# Patient Record
Sex: Female | Born: 1950 | Race: Black or African American | Hispanic: No | Marital: Married | Smoking: Never smoker
Health system: Southern US, Community
[De-identification: ages and names within clinical notes are randomized; demographics above are authoritative.]

## PROBLEM LIST (undated history)

## (undated) DIAGNOSIS — I499 Cardiac arrhythmia, unspecified: Secondary | ICD-10-CM

## (undated) DIAGNOSIS — I2510 Atherosclerotic heart disease of native coronary artery without angina pectoris: Secondary | ICD-10-CM

## (undated) DIAGNOSIS — M4316 Spondylolisthesis, lumbar region: Secondary | ICD-10-CM

## (undated) DIAGNOSIS — Z87442 Personal history of urinary calculi: Secondary | ICD-10-CM

## (undated) DIAGNOSIS — I10 Essential (primary) hypertension: Secondary | ICD-10-CM

## (undated) DIAGNOSIS — Z9889 Other specified postprocedural states: Secondary | ICD-10-CM

## (undated) DIAGNOSIS — I428 Other cardiomyopathies: Secondary | ICD-10-CM

## (undated) DIAGNOSIS — G4730 Sleep apnea, unspecified: Secondary | ICD-10-CM

## (undated) DIAGNOSIS — M1990 Unspecified osteoarthritis, unspecified site: Secondary | ICD-10-CM

## (undated) HISTORY — PX: HERNIA REPAIR: SHX51

## (undated) HISTORY — PX: KIDNEY STONE SURGERY: SHX686

## (undated) HISTORY — PX: DILATION AND CURETTAGE OF UTERUS: SHX78

---

## 2017-07-16 ENCOUNTER — Other Ambulatory Visit: Payer: Self-pay | Admitting: Neurosurgery

## 2017-07-27 NOTE — Pre-Procedure Instructions (Addendum)
Tracey Spencer  07/27/2017      [facility] - [place], [place] - [address] [place] [address] Phone: [phone] Fax: [phone]    Your procedure is scheduled on Tues., Aug 07, 2017 from 11:30AM- 3:17PM  Report to [facility] Admitting Entrance "A" at 9:30AM  Call this number if you have problems the morning of surgery:  [phone]   Remember:  No food or drink after midnight on May 27th  Take these medicines the morning of surgery with A SIP OF WATER: Carvedilol (COREG)  Follow your doctors instructions regarding your Aspirin.  If no instructions were given by your doctor, then you will need to call the prescribing office office to get instructions.    7 days before surgery (5/21), stop taking all Other Aspirin Products, Vitamins, Fish oils, and Herbal medications. Also stop all NSAIDS i.e. Advil, Ibuprofen, Motrin, Aleve, Anaprox, Naproxen, BC and Goody Powders.    Do not wear jewelry, make-up or nail polish.  Do not wear lotions, powders, or perfumes, or deodorant.  Do not shave 48 hours prior to surgery.  Do not bring valuables to the hospital.  [facility] is not responsible for any belongings or valuables.  Contacts, dentures or bridgework may not be worn into surgery.  Leave your suitcase in the car.  After surgery it may be brought to your room.  For patients admitted to the hospital, discharge time will be determined by your treatment team.  Patients discharged the day of surgery will not be allowed to drive home.   Special instructions:   Tracey Spencer- Preparing For Surgery  Before surgery, you can play an important role. Because skin is not sterile, your skin needs to be as free of germs as possible. You can reduce the number of germs on your skin by washing with CHG (chlorahexidine gluconate) Soap before surgery.  CHG is an antiseptic cleaner which kills germs and bonds with the skin to continue killing germs even after  washing.  Oral Hygiene is also important to reduce your risk of infection.  Remember - BRUSH YOUR TEETH THE MORNING OF SURGERY  Please do not use if you have an allergy to CHG or antibacterial soaps. If your skin becomes reddened/irritated stop using the CHG.  Do not shave (including legs and underarms) for at least 48 hours prior to first CHG shower. It is OK to shave your face.  Please follow these instructions carefully.   1. Shower the NIGHT BEFORE SURGERY and the MORNING OF SURGERY with CHG.   2. If you chose to wash your hair, wash your hair first as usual with your normal shampoo.  3. After you shampoo, rinse your hair and body thoroughly to remove the shampoo.  4. Use CHG as you would any other liquid soap. You can apply CHG directly to the skin and wash gently with a scrungie or a clean washcloth.   5. Apply the CHG Soap to your body ONLY FROM THE NECK DOWN.  Do not use on open wounds or open sores. Avoid contact with your eyes, ears, mouth and genitals (private parts). Wash Face and genitals (private parts)  with your normal soap.  6. Wash thoroughly, paying special attention to the area where your surgery will be performed.  7. Thoroughly rinse your body with warm water from the neck down.  8. DO NOT shower/wash with your normal soap after using and rinsing off the CHG Soap.  9. Pat yourself dry with a CLEAN TOWEL.  10. Wear CLEAN PAJAMAS to bed the night before surgery, wear comfortable clothes the morning of surgery  11. Place CLEAN SHEETS on your bed the night of your first shower and DO NOT SLEEP WITH PETS.  Day of Surgery:  Do not apply any deodorants/lotions.  Please wear clean clothes to the hospital/surgery center.   Remember to brush your teeth.    Please read over the following fact sheets that you were given. Pain Booklet, Coughing and Deep Breathing, MRSA Information and Surgical Site Infection Prevention

## 2017-07-30 ENCOUNTER — Encounter (HOSPITAL_COMMUNITY): Payer: Self-pay

## 2017-07-30 ENCOUNTER — Other Ambulatory Visit: Payer: Self-pay

## 2017-07-30 ENCOUNTER — Encounter (HOSPITAL_COMMUNITY)
Admission: RE | Admit: 2017-07-30 | Discharge: 2017-07-30 | Disposition: A | Discharge status: Home / Self Care | Payer: BLUE CROSS/BLUE SHIELD | Source: Ambulatory Visit | Attending: Neurosurgery | Admitting: Neurosurgery

## 2017-07-30 DIAGNOSIS — Z7982 Long term (current) use of aspirin: Secondary | ICD-10-CM | POA: Insufficient documentation

## 2017-07-30 DIAGNOSIS — Z0183 Encounter for blood typing: Secondary | ICD-10-CM | POA: Insufficient documentation

## 2017-07-30 DIAGNOSIS — Z87442 Personal history of urinary calculi: Secondary | ICD-10-CM | POA: Insufficient documentation

## 2017-07-30 DIAGNOSIS — M1990 Unspecified osteoarthritis, unspecified site: Secondary | ICD-10-CM | POA: Diagnosis not present

## 2017-07-30 DIAGNOSIS — I10 Essential (primary) hypertension: Secondary | ICD-10-CM | POA: Diagnosis not present

## 2017-07-30 DIAGNOSIS — Z01818 Encounter for other preprocedural examination: Secondary | ICD-10-CM | POA: Insufficient documentation

## 2017-07-30 DIAGNOSIS — M4316 Spondylolisthesis, lumbar region: Secondary | ICD-10-CM | POA: Diagnosis not present

## 2017-07-30 DIAGNOSIS — Z79899 Other long term (current) drug therapy: Secondary | ICD-10-CM | POA: Diagnosis not present

## 2017-07-30 DIAGNOSIS — R9431 Abnormal electrocardiogram [ECG] [EKG]: Secondary | ICD-10-CM | POA: Diagnosis not present

## 2017-07-30 DIAGNOSIS — Z01812 Encounter for preprocedural laboratory examination: Secondary | ICD-10-CM | POA: Insufficient documentation

## 2017-07-30 DIAGNOSIS — I4510 Unspecified right bundle-branch block: Secondary | ICD-10-CM | POA: Diagnosis not present

## 2017-07-30 DIAGNOSIS — I2510 Atherosclerotic heart disease of native coronary artery without angina pectoris: Secondary | ICD-10-CM | POA: Insufficient documentation

## 2017-07-30 DIAGNOSIS — I428 Other cardiomyopathies: Secondary | ICD-10-CM | POA: Insufficient documentation

## 2017-07-30 DIAGNOSIS — G4730 Sleep apnea, unspecified: Secondary | ICD-10-CM | POA: Insufficient documentation

## 2017-07-30 HISTORY — DX: Spondylolisthesis, lumbar region: M43.16

## 2017-07-30 HISTORY — DX: Other specified postprocedural states: Z98.890

## 2017-07-30 HISTORY — DX: Other cardiomyopathies: I42.8

## 2017-07-30 HISTORY — DX: Unspecified osteoarthritis, unspecified site: M19.90

## 2017-07-30 HISTORY — DX: Atherosclerotic heart disease of native coronary artery without angina pectoris: I25.10

## 2017-07-30 HISTORY — DX: Cardiac arrhythmia, unspecified: I49.9

## 2017-07-30 HISTORY — DX: Personal history of urinary calculi: Z87.442

## 2017-07-30 HISTORY — DX: Sleep apnea, unspecified: G47.30

## 2017-07-30 HISTORY — DX: Essential (primary) hypertension: I10

## 2017-07-30 LAB — TYPE AND SCREEN
ABO/RH(D): B POS
Antibody Screen: NEGATIVE

## 2017-07-30 LAB — BASIC METABOLIC PANEL
Anion gap: 8 (ref 5–15)
BUN: 11 mg/dL (ref 6–20)
CHLORIDE: 103 mmol/L (ref 101–111)
CO2: 30 mmol/L (ref 22–32)
Calcium: 9.3 mg/dL (ref 8.9–10.3)
Creatinine, Ser: 0.74 mg/dL (ref 0.44–1.00)
GFR calc Af Amer: >60 mL/min (ref >60)
GFR calc non Af Amer: >60 mL/min (ref >60)
GLUCOSE: 89 mg/dL (ref 65–99)
Potassium: 3.7 mmol/L (ref 3.5–5.1)
SODIUM: 141 mmol/L (ref 135–145)

## 2017-07-30 LAB — CBC
HEMATOCRIT: 43.5 % (ref 36.0–46.0)
HEMOGLOBIN: 14.1 g/dL (ref 12.0–15.0)
MCH: 28.7 pg (ref 26.0–34.0)
MCHC: 32.4 g/dL (ref 30.0–36.0)
MCV: 88.4 fL (ref 78.0–100.0)
Platelets: 204 10*3/uL (ref 150–400)
RBC: 4.92 MIL/uL (ref 3.87–5.11)
RDW: 13 % (ref 11.5–15.5)
WBC: 3.1 10*3/uL — AB (ref 4.0–10.5)

## 2017-07-30 LAB — SURGICAL PCR SCREEN
MRSA, PCR: NEGATIVE
STAPHYLOCOCCUS AUREUS: NEGATIVE

## 2017-07-30 LAB — ABO/RH: ABO/RH(D): B POS

## 2017-07-30 NOTE — Progress Notes (Signed)
[facility]  Cardiologist - Dr. Lillard Anes  Chest x-ray - Denies  EKG - 07/30/17  Stress Test - Denies  ECHO - 11/16/15 (CE)  Cardiac Cath - 01/15/14 (E)  Sleep Study - Yes- Positive CPAP - Yes  LABS- 07/30/17: CBC, BMP, T/S  ASA- LD- 5/17   Anesthesia- Yes- cardiac history  Pt denies having chest pain, sob, or fever at this time. All instructions explained to the pt, with a verbal understanding of the material. Pt agrees to go over the instructions while at home for a better understanding. The opportunity to ask questions was provided.

## 2017-07-31 ENCOUNTER — Encounter (HOSPITAL_COMMUNITY): Payer: Self-pay

## 2017-07-31 NOTE — Progress Notes (Signed)
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   - Has cardiac clearance for surgery   VS: BP (!) 166/87   Pulse 65   Temp 36.8 C   Resp 20   Ht 5\' 2"  (1.575 m)   Wt 192 lb 1.6 oz (87.1 kg)   SpO2 100%   BMI 35.14 kg/m    PROVIDERS: PCP is Renaldo Harrison, MD in [place], [place] Cardiologist is Lillard Anes, MD (notes in care everywhere) who has cleared pt for surgery. Last office visit 04/03/17   LABS: Labs reviewed: Acceptable for surgery. (all labs ordered are listed, but only abnormal results are displayed)  Labs Reviewed  CBC - Abnormal; Notable for the following components:      Result Value   WBC 3.1 (*)    All other components within normal limits  SURGICAL PCR SCREEN  BASIC METABOLIC PANEL  TYPE AND SCREEN  ABO/RH    EKG 07/30/17: NSR. LAD. Incomplete RBBB. Minimal voltage criteria for LVH, may be normal variant. Septal infarct, age undetermined   CV:   Nuclear stress test 03/26/2017 [facility] [place]): 1.  Normal perfusion.  No ischemia. 2.  Normal LV wall motion and function.  EF 64%.  Echo 03/25/2017 ([facility] [place]): 1.  Mild mitral regurgitation. 2.  Mild to moderate cuspid regurgitation. 3.  Trace pulmonic regurgitation. 4.  EF appears similar to prior echo 2015.  EF 40-45% with global hypokinesia. 5.  Mitral regurgitation.  Previously it was more significant, it is at least moderate before, now mild.  LA not enlarged.  Cardiac cath 11/16/2013 [facility]): 1.  LM normal. 2.  LAD with diffuse irregularities in mid segment (<25%) gives rise to several diagonal branches without disease. 3.  CX: Minimal irregularities in proximal segment (10-25%). 4.  Tiny ramus intermedius is grossly angiographically normal. 5.  RCA: Dominant.  Proximal diffuse irregularities (<25%).  More distally at the site of the bifurcation to the PLA and PDA, there is focal stenosis involving PLA and only about 25% of the PDA is left with about 80% ostial stenosis.  The vessel is very small. 6. Mild nonischemic dilated cardiomyopathy. 7.  Mitral regurgitation estimated to be in the moderately severe range and based on previous echo assessment without any organic disease of the mitral valve itself with a central jet indicating functional etiology likely related to LV dysfunction   Past Medical History:  Diagnosis Date  . Arthritis   . Coronary artery disease    mild nonobstructive by 2015 cath   . History of kidney stones   . History of radiofrequency ablation (RFA) procedure for cardiac arrhythmia    for atrial tachycardia  . Hypertension   . Irregular heart beat   . Nonischemic cardiomyopathy (HCC)   . Sleep apnea    Uses Cpap  . Spondylolisthesis of lumbar region     Past Surgical History:  Procedure Laterality Date  .  DILATION AND CURETTAGE OF UTERUS    . HERNIA REPAIR     abdominal  . KIDNEY STONE SURGERY      MEDICATIONS: . aspirin EC 81 MG tablet  . atorvastatin (LIPITOR) 40 MG tablet  . carvedilol (COREG) 6.25 MG tablet  . Cholecalciferol (VITAMIN D-3) 5000 units TABS  . lisinopril (PRINIVIL,ZESTRIL) 20 MG tablet   No current facility-administered medications for this encounter.     If no changes, I anticipate pt can proceed with surgery as scheduled.   Rica Mast, FNP-BC [facility]hesiology Phone: [phone] 07/31/2017 4:22 PM

## 2017-08-03 NOTE — H&P (Signed)
Patient ID:   [id number] Patient: Tracey Spencer  Date of Birth: [birth year] Visit Type: Office Visit   Date: 07/16/2017 10:00 AM Provider: Danae Orleans. Venetia Maxon MD   This 67 year old female presents for back pain.  HISTORY OF PRESENT ILLNESS:  1.  back pain  Patient returns to discuss surgery plan.  Pt reports some numbness on the left side. 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 Nurse education given.  On exam, she has worsening weakness in her left leg with DF/EHL 4-/5.    Physical therapist canceled therapy and told patient it was not working and that she needed to go ahead with surgery.         Medical/Surgical/Interim History Reviewed, no change.  Last detailed document date:05/07/2017.     PAST MEDICAL HISTORY, SURGICAL HISTORY, FAMILY HISTORY, SOCIAL HISTORY AND REVIEW OF SYSTEMS I have reviewed the patient's past medical, surgical, family and social history as well as the comprehensive review of systems as included on the [facility] history form dated 05/07/2017, which I have signed.  Family History:  Reviewed, no changes.  Last detailed document date:05/07/2017.   Social History: Reviewed, no changes. Last detailed document date: 05/07/2017.    MEDICATIONS: (added, continued or stopped this visit) Started Medication Directions Instruction Stopped   aspirin 81 mg tablet,delayed release take 1 tablet by oral route  every day     atorvastatin 40 mg tablet take 1 tablet by oral route  every day     carvedilol 6.25 mg tablet take 1 tablet by oral route 2 times every day with food     lisinopril 20 mg tablet take 1 tablet by oral route  every day       ALLERGIES: Ingredient Reaction Medication Name Comment  NO KNOWN ALLERGIES     No known allergies. Reviewed, no changes.    PHYSICAL EXAM:   Vitals Date Temp F BP Pulse Ht In Wt Lb  BMI BSA Pain Score  07/16/2017  146/89 66 62 196 35.85  10/10    PHYSICAL EXAM Details General Level of Distress: no acute distress Overall Appearance: normal    Cardiovascular Cardiac: regular rate and rhythm without murmur  Respiratory Lungs: clear to auscultation  Neurological Recent and Remote Memory: normal Attention Span and Concentration:   normal Language: normal Fund of Knowledge: normal  Right Left Sensation: normal normal Upper Extremity Coordination: normal normal  Lower Extremity Coordination: normal normal  Musculoskeletal Gait and Station: normal  Right Left Upper Extremity Muscle Strength: normal normal Lower Extremity Muscle Strength: normal normal Upper Extremity Muscle Tone:  normal normal Lower Extremity Muscle Tone: normal normal   Motor Strength Upper and lower extremity motor strength was tested in the clinically pertinent muscles. Any abnormal findings will be noted below.   Right Left EHL:  4-/5   Deep Tendon Reflexes  Right Left Biceps: normal normal Triceps: normal normal Brachioradialis: normal normal Patellar: normal normal Achilles: normal normal  Sensory Sensation was tested at L1 to S1.   Cranial Nerves II. Optic Nerve/Visual Fields: normal III. Oculomotor: normal IV. Trochlear: normal V. Trigeminal: normal VI. Abducens: normal VII. Facial: normal VIII. Acoustic/Vestibular: normal IX. Glossopharyngeal: normal X. Vagus: normal XI. Spinal Accessory: normal XII. Hypoglossal: normal  Motor and other Tests Lhermittes: negative Rhomberg: negative    Right Left Hoffman's: normal normal Clonus: normal normal Babinski: normal normal SLR: negative negative Patrick's (  Faber): negative negative Toe Walk: normal normal Toe Lift: normal normal Heel Walk: normal normal SI Joint: nontender nontender   Additional Findings:  Dorsiflexion 4-/5    IMPRESSION:   Pt reports some numbness on the left side. 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   PLAN:  Schedule left TLIF L4-5, L5-S1. Nurse education given.  Orders: Diagnostic Procedures: Assessment Procedure  M43.16 Lumbar Spine- AP/Lat/Flex/Ex  M54.16 Lumbar Spine- AP/Lat  Instruction(s)/Education: Assessment Instruction  I10 Hypertension education  Z68.35 Dietary management education, guidance, and counseling  Miscellaneous: Assessment   M43.16 LSO Brace   Completed Orders (this encounter) Order Details Reason Side Interpretation Result Initial Treatment Date Region  Hypertension education Patient to follow up with primary care provider.        Dietary management education, guidance, and counseling patient encouraged to eat a well balanced diet         Assessment/Plan   # Detail Type Description   1. Assessment Low back pain, unspecified back pain laterality, with sciatica presence unspecified (M54.5).       2. Assessment Spondylolisthesis, lumbar region (M43.16).   Plan Orders LSO Brace.       3. Assessment Lumbar radiculopathy (M54.16).       4. Assessment Left leg weakness (R29.898).       5. Assessment Essential (primary) hypertension (I10).       6. Assessment Body mass index (BMI) 35.0-35.9, adult (Z68.35).   Plan Orders Today's instructions / counseling include(s) Dietary management education, guidance, and counseling.         Pain Management Plan Pain Scale: 10/10. Method: Numeric Pain Intensity Scale. Location: back. Onset: 02/23/2017. Duration: varies. Quality: discomforting. Pain management follow-up plan of care: Patient is taking OTC pain relievers for relief..              Provider:  Venetia Maxon MD, Danae Orleans 07/16/2017 4:34 PM  Dictation edited by: Danae Orleans. Venetia Maxon    CC Providers: Renaldo Harrison [address] [place],  [place]  [address]-   Maeola Harman MD  [address] [place], [place]  [address]              Electronically signed by Danae Orleans. Venetia Maxon MD on 07/16/2017 04:34 PM  Patient ID:   [id number] Patient: Tracey Spencer  Date of Birth: 1951/02/17 Visit Type: Office Visit   Date: 06/06/2017 01:30 PM Provider: Danae Orleans. Venetia Maxon MD   This 67 year old female presents for MRI review.  HISTORY OF PRESENT ILLNESS:  1.  MRI review  Patient returns to review her MRI         Medical/Surgical/Interim History Reviewed, no change.  Last detailed document date:05/07/2017.     Family History:  Reviewed, no changes.  Last detailed document date:05/07/2017.   Social History: Reviewed, no changes. Last detailed document date: 05/07/2017.    MEDICATIONS: (added, continued or stopped this visit) Started Medication Directions Instruction Stopped   aspirin 81 mg tablet,delayed release take 1 tablet by oral route  every day     atorvastatin 40 mg tablet take 1 tablet by oral route  every day     carvedilol 6.25 mg tablet take 1 tablet by oral route 2 times every day with food     lisinopril 20 mg tablet take 1 tablet by oral route  every day       ALLERGIES: Ingredient Reaction Medication Name Comment  NO KNOWN ALLERGIES  No known allergies. Reviewed, no changes.    PHYSICAL EXAM:   Vitals Date Temp F BP Pulse Ht In Wt Lb BMI BSA Pain Score  06/06/2017  143/88 66 62 188.4 34.46  10/10      IMPRESSION:   Pt notes no changes in left buttock region. Pain upon sitting. Spondylolisthesis at L4-5 L5-S1 with nerve compression partially caused by a synovial cyst. Recommend physical therapy and injections at the left L5 and S1. Discussed future decompression and fusion surgery.  This would consist of TLIF procedure L4-5 and L5-S1 levels  PLAN:  Nurse education given. Scheduled physical therapy and left L5 and S1 injections. Return for recheck 3-4 weeks after injection  Orders: Office Procedures/Services: Assessment Service Comments  M54.16  Lumbar Spine - NRB - left - L5 - S1    Instruction(s)/Education: Assessment Instruction  I10 Hypertension education  [phone] Dietary management education, guidance, and counseling   Completed Orders (this encounter) Order Details Reason Side Interpretation Result Initial Treatment Date Region  Hypertension education Patient to follow up with primary care provider.        Dietary management education, guidance, and counseling patient encouraged to eat a well balanced diet         Assessment/Plan   # Detail Type Description   1. Assessment Spondylolisthesis, lumbar region (M43.16).       2. Assessment Lumbar radiculopathy (M54.16).   Plan Orders Referral to Odette Fraction PhD MD. Lumbar Spine - NRB - left - L5 - S1.       3. Assessment Essential (primary) hypertension (I10).       4. Assessment Body mass index (BMI) 34.0-34.9, adult (Z68.34).   Plan Orders Today's instructions / counseling include(s) Dietary management education, guidance, and counseling.         Pain Management Plan Pain Scale: 10/10. Method: Numeric Pain Intensity Scale. Location: back. Onset: 02/23/2017. Duration: varies. Quality: discomforting. Pain management follow-up plan of care: Patient is taking OTC pain relievers for relief..              Provider:  Venetia Maxon MD, Danae Orleans 06/10/2017 2:12 PM  Dictation edited by: Danae Orleans. Venetia Maxon    CC Providers: Renaldo Harrison [address] [place],  [place]  [address]-   Maeola Harman MD  [address] [place], [place] [address]              Electronically signed by Danae Orleans. Venetia Maxon MD on 06/10/2017 02:12 PM  Patient ID:   [id number] Patient: Tracey Spencer  Date of Birth: 05-01-1950 Visit Type: Office Visit   Date: 05/07/2017 09:15 AM Provider: Danae Orleans. Venetia Maxon MD   This 68 year old female presents for back pain.   History of Present Illness: 1.  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  Patient recalls slipping on ice December 2018. Left lumbar pain and spasm increased her early January with some upper back pain as well.  She notes "throbbing" pain with sitting, increased pain left buttock and hip with sweeping or waxing floors.  Percocet provided only short term relief.  Stopped Tylenol taken only as needed  History:  HTN, cholesterol, angina (cardiologist Dr. Milinda Antis is a at [facility]) Surgical history:  October 2015 cardiac ablation, December 2018 lithotripsy for kidney stone  CT and x-rays on [facility]  Lumbar radiographs demonstrate spondylolisthesis of L4 on L5 and of L5 on S1 levels at L4-5  on neutral it is a 8 mm decreasing diff 6 mm on flexion increasing to 8 mm on extension and at L5-S1 level neutral is 7 mm and neutral, 7 mm in flexion, 9 mm in extension.   The patient is complaining of left-sided low back and buttock pain.  She says that it problems all day long and it is hard sharp pain she complains of pain into the left foot to the shin and to the ankle.  She is still working but says it is very difficult for her to do so.           PAST MEDICAL/SURGICAL HISTORY   (Detailed)  Disease/disorder Onset Date Management Date Comments    lithotripsy    History of kidney stone      Hypertension         PAST MEDICAL HISTORY, SURGICAL HISTORY, FAMILY HISTORY, SOCIAL HISTORY AND REVIEW OF SYSTEMS I have reviewed the patient's past medical, surgical, family and social history as well as the comprehensive review of systems as included on the [facility] history form dated 05/07/2017, which I have signed.  Family History  (Detailed) Relationship Family Member Name Deceased Age at Death Condition Onset Age Cause of Death      Family history of Hypertension  N      Family history of Diabetes mellitus  N     Social History:  (Detailed) Tobacco use reviewed. Preferred  language is [place].   Tobacco use status: Current non-smoker. Smoking status: Never smoker.  SMOKING STATUS Type Smoking Status Usage Per Day Years Used Total Pack Years   Never smoker          MEDICATIONS(added, continued or stopped this visit): Started Medication Directions Instruction Stopped   aspirin 81 mg tablet,delayed release take 1 tablet by oral route  every day     atorvastatin 40 mg tablet take 1 tablet by oral route  every day     carvedilol 6.25 mg tablet take 1 tablet by oral route 2 times every day with food     lisinopril 20 mg tablet take 1 tablet by oral route  every day       ALLERGIES: Ingredient Reaction Medication Name Comment  NO KNOWN ALLERGIES     No known allergies.   Review of Systems System Neg/Pos Details  Constitutional Negative Chills, Fatigue, Fever, Malaise, Night sweats, Weight gain and Weight loss.  ENMT Negative Ear drainage, Hearing loss, Nasal drainage, Otalgia, Sinus pressure and Sore throat.  Eyes Negative Eye discharge, Eye pain and Vision changes.  Respiratory Negative Chronic cough, Cough, Dyspnea, Known TB exposure and Wheezing.  Cardio Negative Chest pain, Claudication, Edema and Irregular heartbeat/palpitations.  GI Negative Abdominal pain, Blood in stool, Change in stool pattern, Constipation, Decreased appetite, Diarrhea, Heartburn, Nausea and Vomiting.  GU Negative Dysuria, Hematuria, Polyuria (Genitourinary), Urinary frequency, Urinary incontinence and Urinary retention.  Endocrine Negative Cold intolerance, Heat intolerance, Polydipsia and Polyphagia.  Neuro Positive Gait disturbance.  Psych Negative Anxiety, Depression and Insomnia.  Integumentary Negative Brittle hair, Brittle nails, Change in shape/size of mole(s), Hair loss, Hirsutism, Hives, Pruritus, Rash and Skin lesion.  MS Positive Back pain.  Hema/Lymph Negative Easy bleeding, Easy bruising and Lymphadenopathy.  Allergic/Immuno Negative Contact allergy,  Environmental allergies, Food allergies and Seasonal allergies.  Reproductive Negative Breast discharge, Breast lumps, Dysmenorrhea, Dyspareunia, History of abnormal PAP smear, Hot flashes, Irregular menses and Vaginal discharge.   Vitals Date Temp F BP Pulse Ht In Wt Lb  BMI BSA Pain Score  05/07/2017  146/100 65 62 187.8 34.35  10/10     PHYSICAL EXAM General Level of Distress: no acute distress Overall Appearance: normal  Head and Face  Right Left  Fundoscopic Exam:  normal normal    Cardiovascular Cardiac: regular rate and rhythm without murmur  Right Left  Carotid Pulses: normal normal  Respiratory Lungs: clear to auscultation  Neurological Orientation: normal Recent and Remote Memory: normal Attention Span and Concentration:   normal Language: normal Fund of Knowledge: normal  Right Left Sensation: normal normal Upper Extremity Coordination: normal normal  Lower Extremity Coordination: normal normal  Musculoskeletal Gait and Station: normal  Right Left Upper Extremity Muscle Strength: normal normal Lower Extremity Muscle Strength: normal normal Upper Extremity Muscle Tone:  normal normal Lower Extremity Muscle Tone: normal normal   Motor Strength Upper and lower extremity motor strength was tested in the clinically pertinent muscles.     Deep Tendon Reflexes  Right Left Biceps: normal normal Triceps: normal normal Brachioradialis: normal normal Patellar: normal normal Achilles: normal normal  Sensory Sensation was tested at L1 to S1.   Cranial Nerves II. Optic Nerve/Visual Fields: normal III. Oculomotor: normal IV. Trochlear: normal V. Trigeminal: normal VI. Abducens: normal VII. Facial: normal VIII. Acoustic/Vestibular: normal IX. Glossopharyngeal: normal X. Vagus: normal XI. Spinal Accessory: normal XII. Hypoglossal: normal  Motor and other Tests Lhermittes: negative Rhomberg: negative Pronator  drift: absent     Right Left Hoffman's: normal normal Clonus: normal normal Babinski: normal normal SLR: negative positive at 30 degrees Patrick's Pearlean Brownie): negative negative Toe Walk: normal normal Toe Lift: normal normal Heel Walk: normal normal SI Joint: nontender nontender   Additional Findings:  Left sciatic notch pain to palpation     IMPRESSION The patient has mobile spondylolisthesis of L4 and L5 and of L5 and S1 levels.  She has positive straight leg raise and left sciatica.   Completed Orders (this encounter) Order Details Reason Side Interpretation Result Initial Treatment Date Region  Dietary management education, guidance, and counseling Encouraged to eat a well balanced diet and follow up with primary care physician.        Hypertension education Follow up with primary care physician for elevated blood pressure.        Lumbar Spine- AP/Lat/Obls/Spot/Flex/Ex      05/07/2017 All Levels to All Levels   Assessment/Plan # Detail Type Description   1. Assessment Low back pain, unspecified back pain laterality, with sciatica presence unspecified (M54.5).       2. Assessment Spondylolisthesis, lumbar region (M43.16).       3. Assessment Lumbar radiculopathy (M54.16).       4. Assessment Body mass index (BMI) 34.0-34.9, adult (Z68.34).   Plan Orders Today's instructions / counseling include(s) Dietary management education, guidance, and counseling.       5. Assessment Essential (primary) hypertension (I10).           Pain Management Plan Pain Scale: 10/10. Method: Numeric Pain Intensity Scale. Location: lower back down left leg. Onset: 02/23/2017. Duration: varies. Quality: discomforting. Pain management follow-up plan of care: Patient is taking over the counter pain relievers for relief..  I have recommended lumbar imaging be obtained and the patient will see me back after this has been performed.   Orders: Diagnostic Procedures: Assessment Procedure   M43.16 MRI Spine/lumb W/o Contrast  M54.16 Lumbar Spine- AP/Lat/Obls/Spot/Flex/Ex  Instruction(s)/Education: Assessment Instruction  I10 Hypertension education  [phone] Dietary management education, guidance, and counseling  Provider:  Venetia Maxon MD, Danae Orleans 05/13/2017 6:26 PM  Dictation edited by: Danae Orleans. Venetia Maxon    CC Providers: Renaldo Harrison [address] [place],  [place]  [address]-   Maeola Harman MD  [address] [place], [place] [address]              Electronically signed by Danae Orleans. Venetia Maxon MD on 05/13/2017 06:27 PM

## 2017-08-07 ENCOUNTER — Inpatient Hospital Stay (HOSPITAL_COMMUNITY): Payer: BLUE CROSS/BLUE SHIELD

## 2017-08-07 ENCOUNTER — Encounter (HOSPITAL_COMMUNITY): Payer: Self-pay | Admitting: *Deleted

## 2017-08-07 ENCOUNTER — Inpatient Hospital Stay (HOSPITAL_COMMUNITY)
Admission: RE | Disposition: A | Discharge status: Skilled Nursing Facility | Payer: Self-pay | Source: Ambulatory Visit | Attending: Neurosurgery

## 2017-08-07 ENCOUNTER — Inpatient Hospital Stay (HOSPITAL_COMMUNITY): Payer: BLUE CROSS/BLUE SHIELD | Admitting: Emergency Medicine

## 2017-08-07 ENCOUNTER — Other Ambulatory Visit: Payer: Self-pay

## 2017-08-07 ENCOUNTER — Inpatient Hospital Stay (HOSPITAL_COMMUNITY): Payer: BLUE CROSS/BLUE SHIELD | Admitting: Certified Registered"

## 2017-08-07 ENCOUNTER — Inpatient Hospital Stay (HOSPITAL_COMMUNITY)
Admission: RE | Admit: 2017-08-07 | Discharge: 2017-08-11 | DRG: 455 | Disposition: A | Discharge status: Skilled Nursing Facility | Payer: BLUE CROSS/BLUE SHIELD | Source: Ambulatory Visit | Attending: Neurosurgery | Admitting: Neurosurgery

## 2017-08-07 DIAGNOSIS — Z885 Allergy status to narcotic agent status: Secondary | ICD-10-CM

## 2017-08-07 DIAGNOSIS — I10 Essential (primary) hypertension: Secondary | ICD-10-CM | POA: Diagnosis present

## 2017-08-07 DIAGNOSIS — Z79899 Other long term (current) drug therapy: Secondary | ICD-10-CM

## 2017-08-07 DIAGNOSIS — M549 Dorsalgia, unspecified: Secondary | ICD-10-CM | POA: Diagnosis present

## 2017-08-07 DIAGNOSIS — M4696 Unspecified inflammatory spondylopathy, lumbar region: Secondary | ICD-10-CM | POA: Diagnosis present

## 2017-08-07 DIAGNOSIS — Z419 Encounter for procedure for purposes other than remedying health state, unspecified: Secondary | ICD-10-CM

## 2017-08-07 DIAGNOSIS — Z833 Family history of diabetes mellitus: Secondary | ICD-10-CM

## 2017-08-07 DIAGNOSIS — Z7982 Long term (current) use of aspirin: Secondary | ICD-10-CM | POA: Diagnosis not present

## 2017-08-07 DIAGNOSIS — M7138 Other bursal cyst, other site: Secondary | ICD-10-CM | POA: Diagnosis present

## 2017-08-07 DIAGNOSIS — Z8249 Family history of ischemic heart disease and other diseases of the circulatory system: Secondary | ICD-10-CM | POA: Diagnosis not present

## 2017-08-07 DIAGNOSIS — M5116 Intervertebral disc disorders with radiculopathy, lumbar region: Secondary | ICD-10-CM | POA: Diagnosis present

## 2017-08-07 DIAGNOSIS — Z87442 Personal history of urinary calculi: Secondary | ICD-10-CM

## 2017-08-07 DIAGNOSIS — M4316 Spondylolisthesis, lumbar region: Secondary | ICD-10-CM | POA: Diagnosis present

## 2017-08-07 HISTORY — PX: TRANSFORAMINAL LUMBAR INTERBODY FUSION (TLIF) WITH PEDICLE SCREW FIXATION 2 LEVEL: SHX6142

## 2017-08-07 SURGERY — TRANSFORAMINAL LUMBAR INTERBODY FUSION (TLIF) WITH PEDICLE SCREW FIXATION 2 LEVEL
Anesthesia: General | Site: Back | Laterality: Left

## 2017-08-07 MED ORDER — CEFAZOLIN SODIUM-DEXTROSE 2-4 GM/100ML-% IV SOLN
2.0000 g | INTRAVENOUS | Status: AC
  Administered 2017-08-07: 2 g via INTRAVENOUS

## 2017-08-07 MED ORDER — CEFAZOLIN SODIUM-DEXTROSE 2-4 GM/100ML-% IV SOLN
2.0000 g | Freq: Three times a day (TID) | INTRAVENOUS | Status: AC
  Administered 2017-08-07 – 2017-08-08 (×2): 2 g via INTRAVENOUS
  Filled 2017-08-07 (×2): qty 100

## 2017-08-07 MED ORDER — ESMOLOL HCL 100 MG/10ML IV SOLN
INTRAVENOUS | Status: DC | PRN
  Administered 2017-08-07: 40 mg via INTRAVENOUS

## 2017-08-07 MED ORDER — HYDROCODONE-ACETAMINOPHEN 7.5-325 MG PO TABS
2.0000 | ORAL_TABLET | ORAL | Status: DC | PRN
  Administered 2017-08-07 – 2017-08-11 (×15): 2 via ORAL
  Filled 2017-08-07 (×15): qty 2

## 2017-08-07 MED ORDER — SODIUM CHLORIDE 0.9 % IV SOLN: 250.0000 mL | INTRAVENOUS | Status: DC

## 2017-08-07 MED ORDER — METHOCARBAMOL 500 MG PO TABS
500.0000 mg | ORAL_TABLET | Freq: Four times a day (QID) | ORAL | Status: DC | PRN
  Administered 2017-08-07 – 2017-08-11 (×7): 500 mg via ORAL
  Filled 2017-08-07 (×6): qty 1

## 2017-08-07 MED ORDER — CHLORHEXIDINE GLUCONATE CLOTH 2 % EX PADS: 6.0000 | MEDICATED_PAD | Freq: Once | CUTANEOUS | Status: DC

## 2017-08-07 MED ORDER — FAMOTIDINE IN NACL 20-0.9 MG/50ML-% IV SOLN: 20.0000 mg | Freq: Two times a day (BID) | INTRAVENOUS | Status: DC

## 2017-08-07 MED ORDER — ACETAMINOPHEN 325 MG PO TABS: 650.0000 mg | ORAL_TABLET | ORAL | Status: DC | PRN

## 2017-08-07 MED ORDER — ONDANSETRON HCL 4 MG/2ML IJ SOLN
INTRAMUSCULAR | Status: AC
  Filled 2017-08-07: qty 2

## 2017-08-07 MED ORDER — MIDAZOLAM HCL 5 MG/5ML IJ SOLN
INTRAMUSCULAR | Status: DC | PRN
  Administered 2017-08-07: 2 mg via INTRAVENOUS

## 2017-08-07 MED ORDER — DOCUSATE SODIUM 100 MG PO CAPS
100.0000 mg | ORAL_CAPSULE | Freq: Two times a day (BID) | ORAL | Status: DC
  Administered 2017-08-07 – 2017-08-11 (×8): 100 mg via ORAL
  Filled 2017-08-07 (×8): qty 1

## 2017-08-07 MED ORDER — ACETAMINOPHEN 650 MG RE SUPP: 650.0000 mg | RECTAL | Status: DC | PRN

## 2017-08-07 MED ORDER — POLYETHYLENE GLYCOL 3350 17 G PO PACK
17.0000 g | PACK | Freq: Every day | ORAL | Status: DC | PRN
  Administered 2017-08-09 – 2017-08-11 (×2): 17 g via ORAL
  Filled 2017-08-07 (×4): qty 1

## 2017-08-07 MED ORDER — LACTATED RINGERS IV SOLN
Freq: Once | INTRAVENOUS | Status: AC
  Administered 2017-08-07: 10:00:00 via INTRAVENOUS

## 2017-08-07 MED ORDER — FLEET ENEMA 7-19 GM/118ML RE ENEM: 1.0000 | ENEMA | Freq: Once | RECTAL | Status: DC | PRN

## 2017-08-07 MED ORDER — ZOLPIDEM TARTRATE 5 MG PO TABS: 5.0000 mg | ORAL_TABLET | Freq: Every evening | ORAL | Status: DC | PRN

## 2017-08-07 MED ORDER — CARVEDILOL 6.25 MG PO TABS
6.2500 mg | ORAL_TABLET | Freq: Two times a day (BID) | ORAL | Status: DC
  Administered 2017-08-07 – 2017-08-11 (×6): 6.25 mg via ORAL
  Filled 2017-08-07 (×7): qty 1

## 2017-08-07 MED ORDER — LIDOCAINE-EPINEPHRINE 1 %-1:100000 IJ SOLN
INTRAMUSCULAR | Status: DC | PRN
  Administered 2017-08-07: 5 mL

## 2017-08-07 MED ORDER — 0.9 % SODIUM CHLORIDE (POUR BTL) OPTIME
TOPICAL | Status: DC | PRN
  Administered 2017-08-07: 1000 mL

## 2017-08-07 MED ORDER — ROCURONIUM BROMIDE 10 MG/ML (PF) SYRINGE
PREFILLED_SYRINGE | INTRAVENOUS | Status: DC | PRN
  Administered 2017-08-07: 50 mg via INTRAVENOUS

## 2017-08-07 MED ORDER — PHENOL 1.4 % MT LIQD
1.0000 | OROMUCOSAL | Status: DC | PRN
Start: 2017-08-07 — End: 2017-08-11

## 2017-08-07 MED ORDER — BUPIVACAINE LIPOSOME 1.3 % IJ SUSP
20.0000 mL | INTRAMUSCULAR | Status: AC
  Administered 2017-08-07: 20 mL
  Filled 2017-08-07: qty 20

## 2017-08-07 MED ORDER — BUPIVACAINE HCL (PF) 0.5 % IJ SOLN
INTRAMUSCULAR | Status: DC | PRN
  Administered 2017-08-07: 5 mL

## 2017-08-07 MED ORDER — PROPOFOL 10 MG/ML IV BOLUS
INTRAVENOUS | Status: DC | PRN
  Administered 2017-08-07: 150 mg via INTRAVENOUS

## 2017-08-07 MED ORDER — PHENYLEPHRINE 40 MCG/ML (10ML) SYRINGE FOR IV PUSH (FOR BLOOD PRESSURE SUPPORT)
PREFILLED_SYRINGE | INTRAVENOUS | Status: DC | PRN
  Administered 2017-08-07 (×5): 80 ug via INTRAVENOUS

## 2017-08-07 MED ORDER — DEXTROSE 5 % IV SOLN
INTRAVENOUS | Status: DC | PRN
  Administered 2017-08-07: 40 ug/min via INTRAVENOUS

## 2017-08-07 MED ORDER — THROMBIN 5000 UNITS EX SOLR
CUTANEOUS | Status: AC
  Filled 2017-08-07: qty 5000

## 2017-08-07 MED ORDER — FAMOTIDINE 20 MG PO TABS
20.0000 mg | ORAL_TABLET | Freq: Two times a day (BID) | ORAL | Status: DC
  Administered 2017-08-07 – 2017-08-11 (×8): 20 mg via ORAL
  Filled 2017-08-07 (×8): qty 1

## 2017-08-07 MED ORDER — SUGAMMADEX SODIUM 200 MG/2ML IV SOLN
INTRAVENOUS | Status: AC
  Filled 2017-08-07: qty 2

## 2017-08-07 MED ORDER — VITAMIN D 1000 UNITS PO TABS
5000.0000 [IU] | ORAL_TABLET | Freq: Every day | ORAL | Status: DC
  Filled 2017-08-07: qty 5

## 2017-08-07 MED ORDER — HYDROXYZINE HCL 50 MG/ML IM SOLN
50.0000 mg | Freq: Four times a day (QID) | INTRAMUSCULAR | Status: DC | PRN
  Administered 2017-08-07: 50 mg via INTRAMUSCULAR
  Filled 2017-08-07: qty 1

## 2017-08-07 MED ORDER — ROCURONIUM BROMIDE 50 MG/5ML IV SOLN
INTRAVENOUS | Status: AC
  Filled 2017-08-07: qty 1

## 2017-08-07 MED ORDER — HYDROMORPHONE HCL 2 MG/ML IJ SOLN: 0.2500 mg | INTRAMUSCULAR | Status: DC | PRN

## 2017-08-07 MED ORDER — MORPHINE SULFATE (PF) 4 MG/ML IV SOLN
2.0000 mg | INTRAVENOUS | Status: DC | PRN
  Administered 2017-08-07: 2 mg via INTRAVENOUS
  Filled 2017-08-07: qty 1

## 2017-08-07 MED ORDER — ONDANSETRON HCL 4 MG PO TABS
4.0000 mg | ORAL_TABLET | Freq: Four times a day (QID) | ORAL | Status: DC | PRN
  Administered 2017-08-09: 4 mg via ORAL
  Filled 2017-08-07: qty 1

## 2017-08-07 MED ORDER — PROPOFOL 10 MG/ML IV BOLUS
INTRAVENOUS | Status: AC
  Filled 2017-08-07: qty 20

## 2017-08-07 MED ORDER — PHENYLEPHRINE 40 MCG/ML (10ML) SYRINGE FOR IV PUSH (FOR BLOOD PRESSURE SUPPORT)
PREFILLED_SYRINGE | INTRAVENOUS | Status: AC
  Filled 2017-08-07: qty 10

## 2017-08-07 MED ORDER — ONDANSETRON HCL 4 MG/2ML IJ SOLN
INTRAMUSCULAR | Status: DC | PRN
  Administered 2017-08-07: 4 mg via INTRAVENOUS

## 2017-08-07 MED ORDER — ONDANSETRON HCL 4 MG/2ML IJ SOLN
4.0000 mg | Freq: Four times a day (QID) | INTRAMUSCULAR | Status: DC | PRN
  Administered 2017-08-07: 4 mg via INTRAVENOUS

## 2017-08-07 MED ORDER — LACTATED RINGERS IV SOLN
INTRAVENOUS | Status: DC | PRN
  Administered 2017-08-07 (×2): via INTRAVENOUS

## 2017-08-07 MED ORDER — METHOCARBAMOL 500 MG PO TABS
ORAL_TABLET | ORAL | Status: AC
  Administered 2017-08-07: 500 mg via ORAL
  Filled 2017-08-07: qty 1

## 2017-08-07 MED ORDER — SODIUM CHLORIDE 0.9% FLUSH: 3.0000 mL | Freq: Two times a day (BID) | INTRAVENOUS | Status: DC

## 2017-08-07 MED ORDER — HYDROCODONE-ACETAMINOPHEN 5-325 MG PO TABS
2.0000 | ORAL_TABLET | ORAL | Status: DC | PRN
  Administered 2017-08-07: 2 via ORAL

## 2017-08-07 MED ORDER — KCL IN DEXTROSE-NACL 20-5-0.45 MEQ/L-%-% IV SOLN: INTRAVENOUS | Status: DC

## 2017-08-07 MED ORDER — NEOSTIGMINE METHYLSULFATE 5 MG/5ML IV SOSY
PREFILLED_SYRINGE | INTRAVENOUS | Status: AC
  Filled 2017-08-07: qty 5

## 2017-08-07 MED ORDER — VANCOMYCIN HCL 1000 MG IV SOLR
INTRAVENOUS | Status: AC
  Filled 2017-08-07: qty 1000

## 2017-08-07 MED ORDER — HYDROCODONE-ACETAMINOPHEN 5-325 MG PO TABS
1.0000 | ORAL_TABLET | ORAL | Status: DC | PRN
  Administered 2017-08-08 – 2017-08-11 (×3): 1 via ORAL
  Filled 2017-08-07 (×3): qty 1

## 2017-08-07 MED ORDER — BUPIVACAINE HCL (PF) 0.5 % IJ SOLN
INTRAMUSCULAR | Status: AC
  Filled 2017-08-07: qty 30

## 2017-08-07 MED ORDER — CEFAZOLIN SODIUM-DEXTROSE 2-4 GM/100ML-% IV SOLN
INTRAVENOUS | Status: AC
  Filled 2017-08-07: qty 100

## 2017-08-07 MED ORDER — SUGAMMADEX SODIUM 200 MG/2ML IV SOLN
INTRAVENOUS | Status: DC | PRN
  Administered 2017-08-07: 150 mg via INTRAVENOUS

## 2017-08-07 MED ORDER — MIDAZOLAM HCL 2 MG/2ML IJ SOLN
INTRAMUSCULAR | Status: AC
  Filled 2017-08-07: qty 2

## 2017-08-07 MED ORDER — SODIUM CHLORIDE 0.9 % IJ SOLN
INTRAMUSCULAR | Status: AC
  Filled 2017-08-07: qty 10

## 2017-08-07 MED ORDER — HYDROCODONE-ACETAMINOPHEN 5-325 MG PO TABS
ORAL_TABLET | ORAL | Status: AC
  Administered 2017-08-07: 2 via ORAL
  Filled 2017-08-07: qty 2

## 2017-08-07 MED ORDER — LIDOCAINE 2% (20 MG/ML) 5 ML SYRINGE
INTRAMUSCULAR | Status: AC
  Filled 2017-08-07: qty 5

## 2017-08-07 MED ORDER — LIDOCAINE 2% (20 MG/ML) 5 ML SYRINGE
INTRAMUSCULAR | Status: DC | PRN
  Administered 2017-08-07: 40 mg via INTRAVENOUS

## 2017-08-07 MED ORDER — DEXMEDETOMIDINE HCL IN NACL 200 MCG/50ML IV SOLN
INTRAVENOUS | Status: AC
  Filled 2017-08-07: qty 50

## 2017-08-07 MED ORDER — DEXTROSE 5 % IV SOLN
500.0000 mg | Freq: Four times a day (QID) | INTRAVENOUS | Status: DC | PRN
  Filled 2017-08-07: qty 5

## 2017-08-07 MED ORDER — MENTHOL 3 MG MT LOZG: 1.0000 | LOZENGE | OROMUCOSAL | Status: DC | PRN

## 2017-08-07 MED ORDER — THROMBIN 20000 UNITS EX SOLR
CUTANEOUS | Status: AC
  Filled 2017-08-07: qty 20000

## 2017-08-07 MED ORDER — LISINOPRIL 20 MG PO TABS
20.0000 mg | ORAL_TABLET | Freq: Every day | ORAL | Status: DC
  Administered 2017-08-07 – 2017-08-11 (×3): 20 mg via ORAL
  Filled 2017-08-07 (×3): qty 1

## 2017-08-07 MED ORDER — BISACODYL 10 MG RE SUPP
10.0000 mg | Freq: Every day | RECTAL | Status: DC | PRN
  Filled 2017-08-07: qty 1

## 2017-08-07 MED ORDER — ALUM & MAG HYDROXIDE-SIMETH 200-200-20 MG/5ML PO SUSP
30.0000 mL | Freq: Four times a day (QID) | ORAL | Status: DC | PRN
  Filled 2017-08-07: qty 30

## 2017-08-07 MED ORDER — DEXAMETHASONE SODIUM PHOSPHATE 10 MG/ML IJ SOLN
INTRAMUSCULAR | Status: DC | PRN
  Administered 2017-08-07: 5 mg via INTRAVENOUS

## 2017-08-07 MED ORDER — THROMBIN (RECOMBINANT) 5000 UNITS EX SOLR
OROMUCOSAL | Status: DC | PRN
  Administered 2017-08-07: 13:00:00 via TOPICAL

## 2017-08-07 MED ORDER — SODIUM CHLORIDE 0.9% FLUSH: 3.0000 mL | INTRAVENOUS | Status: DC | PRN

## 2017-08-07 MED ORDER — SUFENTANIL CITRATE 50 MCG/ML IV SOLN
INTRAVENOUS | Status: AC
Start: 2017-08-07 — End: ?
  Filled 2017-08-07: qty 1

## 2017-08-07 MED ORDER — LIDOCAINE-EPINEPHRINE 1 %-1:100000 IJ SOLN
INTRAMUSCULAR | Status: AC
  Filled 2017-08-07: qty 1

## 2017-08-07 MED ORDER — ATORVASTATIN CALCIUM 40 MG PO TABS
40.0000 mg | ORAL_TABLET | Freq: Every day | ORAL | Status: DC
  Administered 2017-08-07 – 2017-08-10 (×4): 40 mg via ORAL
  Filled 2017-08-07: qty 2
  Filled 2017-08-07 (×3): qty 1

## 2017-08-07 MED ORDER — SUFENTANIL CITRATE 50 MCG/ML IV SOLN
INTRAVENOUS | Status: DC | PRN
  Administered 2017-08-07: 5 ug via INTRAVENOUS
  Administered 2017-08-07: 10 ug via INTRAVENOUS
  Administered 2017-08-07: 15 ug via INTRAVENOUS
  Administered 2017-08-07: 5 ug via INTRAVENOUS

## 2017-08-07 MED ORDER — DEXAMETHASONE SODIUM PHOSPHATE 10 MG/ML IJ SOLN
INTRAMUSCULAR | Status: AC
  Filled 2017-08-07: qty 1

## 2017-08-07 SURGICAL SUPPLY — 74 items
BASKET BONE COLLECTION (BASKET) ×3 IMPLANT
BLADE CLIPPER SURG (BLADE) IMPLANT
BONE CANC CHIPS 20CC PCAN1/4 (Bone Implant) ×3 IMPLANT
BUR MATCHSTICK NEURO 3.0 LAGG (BURR) ×3 IMPLANT
BUR PRECISION FLUTE 5.0 (BURR) ×3 IMPLANT
CANISTER SUCT 3000ML PPV (MISCELLANEOUS) ×3 IMPLANT
CARTRIDGE OIL MAESTRO DRILL (MISCELLANEOUS) ×1 IMPLANT
CHIPS CANC BONE 20CC PCAN1/4 (Bone Implant) ×1 IMPLANT
CONT SPEC 4OZ CLIKSEAL STRL BL (MISCELLANEOUS) ×3 IMPLANT
COROENT LO 10X10X25MM (Cage) ×6 IMPLANT
COVER BACK TABLE 24X17X13 BIG (DRAPES) IMPLANT
COVER BACK TABLE 60X90IN (DRAPES) ×3 IMPLANT
DECANTER SPIKE VIAL GLASS SM (MISCELLANEOUS) IMPLANT
DERMABOND ADVANCED (GAUZE/BANDAGES/DRESSINGS) ×2
DERMABOND ADVANCED .7 DNX12 (GAUZE/BANDAGES/DRESSINGS) ×1 IMPLANT
DIFFUSER DRILL AIR PNEUMATIC (MISCELLANEOUS) ×3 IMPLANT
DRAPE C-ARM 42X72 X-RAY (DRAPES) ×3 IMPLANT
DRAPE C-ARMOR (DRAPES) ×3 IMPLANT
DRAPE LAPAROTOMY 100X72X124 (DRAPES) ×3 IMPLANT
DRAPE POUCH INSTRU U-SHP 10X18 (DRAPES) IMPLANT
DRAPE SURG 17X23 STRL (DRAPES) ×3 IMPLANT
DRSG OPSITE POSTOP 4X8 (GAUZE/BANDAGES/DRESSINGS) ×3 IMPLANT
DURAPREP 26ML APPLICATOR (WOUND CARE) ×3 IMPLANT
ELECT BLADE 4.0 EZ CLEAN MEGAD (MISCELLANEOUS) ×3
ELECT REM PT RETURN 9FT ADLT (ELECTROSURGICAL) ×3
ELECTRODE BLDE 4.0 EZ CLN MEGD (MISCELLANEOUS) ×1 IMPLANT
ELECTRODE REM PT RTRN 9FT ADLT (ELECTROSURGICAL) ×1 IMPLANT
GAUZE SPONGE 4X4 12PLY STRL (GAUZE/BANDAGES/DRESSINGS) ×3 IMPLANT
GAUZE SPONGE 4X4 16PLY XRAY LF (GAUZE/BANDAGES/DRESSINGS) IMPLANT
GLOVE BIO SURGEON STRL SZ8 (GLOVE) ×6 IMPLANT
GLOVE BIOGEL PI IND STRL 8 (GLOVE) ×2 IMPLANT
GLOVE BIOGEL PI IND STRL 8.5 (GLOVE) ×2 IMPLANT
GLOVE BIOGEL PI INDICATOR 8 (GLOVE) ×4
GLOVE BIOGEL PI INDICATOR 8.5 (GLOVE) ×4
GLOVE ECLIPSE 8.0 STRL XLNG CF (GLOVE) ×6 IMPLANT
GLOVE EXAM NITRILE LRG STRL (GLOVE) IMPLANT
GLOVE EXAM NITRILE XL STR (GLOVE) IMPLANT
GLOVE EXAM NITRILE XS STR PU (GLOVE) IMPLANT
GOWN STRL REUS W/ TWL LRG LVL3 (GOWN DISPOSABLE) IMPLANT
GOWN STRL REUS W/ TWL XL LVL3 (GOWN DISPOSABLE) ×3 IMPLANT
GOWN STRL REUS W/TWL 2XL LVL3 (GOWN DISPOSABLE) IMPLANT
GOWN STRL REUS W/TWL LRG LVL3 (GOWN DISPOSABLE)
GOWN STRL REUS W/TWL XL LVL3 (GOWN DISPOSABLE) ×6
KIT BASIN OR (CUSTOM PROCEDURE TRAY) ×3 IMPLANT
KIT INFUSE X SMALL 1.4CC (Orthopedic Implant) ×3 IMPLANT
KIT POSITION SURG JACKSON T1 (MISCELLANEOUS) ×3 IMPLANT
KIT TURNOVER KIT B (KITS) ×3 IMPLANT
NEEDLE HYPO 21X1.5 SAFETY (NEEDLE) ×3 IMPLANT
NEEDLE HYPO 25X1 1.5 SAFETY (NEEDLE) ×3 IMPLANT
NEEDLE SPNL 18GX3.5 QUINCKE PK (NEEDLE) IMPLANT
NS IRRIG 1000ML POUR BTL (IV SOLUTION) ×3 IMPLANT
OIL CARTRIDGE MAESTRO DRILL (MISCELLANEOUS) ×3
PACK LAMINECTOMY NEURO (CUSTOM PROCEDURE TRAY) ×3 IMPLANT
PAD ARMBOARD 7.5X6 YLW CONV (MISCELLANEOUS) ×9 IMPLANT
PATTIES SURGICAL .5 X.5 (GAUZE/BANDAGES/DRESSINGS) IMPLANT
PATTIES SURGICAL .5 X1 (DISPOSABLE) IMPLANT
PATTIES SURGICAL 1X1 (DISPOSABLE) IMPLANT
ROD RELIN-O LORD 5.5X65MM (Rod) ×3 IMPLANT
ROD RELINE-O LORDOTIC 5.5X60MM (Rod) ×3 IMPLANT
SCREW LOCK RELINE 5.5 TULIP (Screw) ×18 IMPLANT
SCREW RELINE-O POLY 6.5X45 (Screw) ×18 IMPLANT
SPONGE LAP 4X18 X RAY DECT (DISPOSABLE) IMPLANT
SPONGE SURGIFOAM ABS GEL 100 (HEMOSTASIS) IMPLANT
STAPLER SKIN PROX WIDE 3.9 (STAPLE) IMPLANT
SUT VIC AB 1 CT1 18XBRD ANBCTR (SUTURE) ×1 IMPLANT
SUT VIC AB 1 CT1 8-18 (SUTURE) ×2
SUT VIC AB 2-0 CT1 18 (SUTURE) ×3 IMPLANT
SUT VIC AB 3-0 SH 8-18 (SUTURE) ×6 IMPLANT
SYR 5ML LL (SYRINGE) IMPLANT
SYRINGE 20CC LL (MISCELLANEOUS) ×3 IMPLANT
TOWEL GREEN STERILE (TOWEL DISPOSABLE) ×3 IMPLANT
TOWEL GREEN STERILE FF (TOWEL DISPOSABLE) ×3 IMPLANT
TRAY FOLEY MTR SLVR 16FR STAT (SET/KITS/TRAYS/PACK) ×3 IMPLANT
WATER STERILE IRR 1000ML POUR (IV SOLUTION) ×3 IMPLANT

## 2017-08-07 NOTE — Interval H&P Note (Signed)
History and Physical Interval Note:  08/07/2017 11:37 AM  Tracey Spencer  has presented today for surgery, with the diagnosis of SPONDYLOLISTHESIS, LUMBAR REGION  The various methods of treatment have been discussed with the patient and family. After consideration of risks, benefits and other options for treatment, the patient has consented to  Procedure(s) with comments: LEFT LUMBAR 4- LUMBAR 5, LUMBAR 5- SACRAL 1 TRANSFORAMINAL LUMBAR INTERBODY FUSION (Left) - LEFT LUMBAR 4- LUMBAR 5, LUMBAR 5- SACRAL 1 TRANSFORAMINAL LUMBAR INTERBODY FUSION as a surgical intervention .  The patient's history has been reviewed, patient examined, no change in status, stable for surgery.  I have reviewed the patient's chart and labs.  Questions were answered to the patient's satisfaction.     [name] D

## 2017-08-07 NOTE — Anesthesia Preprocedure Evaluation (Signed)
Anesthesia Evaluation  Patient identified by MRN, date of birth, ID band Patient awake    Reviewed: Allergy & Precautions, NPO status , Patient's Chart, lab work & pertinent test results  Airway Mallampati: III  TM Distance: >3 FB Neck ROM: Full    Dental  (+) Teeth Intact, Dental Advisory Given   Pulmonary    breath sounds clear to auscultation       Cardiovascular hypertension,  Rhythm:Regular Rate:Normal     Neuro/Psych    GI/Hepatic   Endo/Other    Renal/GU      Musculoskeletal   Abdominal   Peds  Hematology   Anesthesia Other Findings   Reproductive/Obstetrics                             Anesthesia Physical Anesthesia Plan  ASA: III  Anesthesia Plan: General   Post-op Pain Management:    Induction: Intravenous  PONV Risk Score and Plan: 1 and Ondansetron and Dexamethasone  Airway Management Planned: Oral ETT  Additional Equipment:   Intra-op Plan:   Post-operative Plan: Extubation in OR  Informed Consent: I have reviewed the patients History and Physical, chart, labs and discussed the procedure including the risks, benefits and alternatives for the proposed anesthesia with the patient or authorized representative who has indicated his/her understanding and acceptance.   Dental advisory given  Plan Discussed with: CRNA and Anesthesiologist  Anesthesia Plan Comments:         Anesthesia Quick Evaluation

## 2017-08-07 NOTE — Progress Notes (Signed)
Awake, alert, conversant.  MAEW with good power.  Doing well.  

## 2017-08-07 NOTE — Op Note (Signed)
08/07/2017  4:03 PM  PATIENT:  Tracey Spencer  67 y.o. female  PRE-OPERATIVE DIAGNOSIS:  SPONDYLOLISTHESIS, LUMBAR REGION, lumbar synovial cyst, lumbar stenosis, herniated lumbar disc, lumbago, radiculopathy L 45 and L 5 S 1 levels  POST-OPERATIVE DIAGNOSIS:  SPONDYLOLISTHESIS, LUMBAR REGION, lumbar synovial cyst, lumbar stenosis, herniated lumbar disc, lumbago, radiculopathy L 45 and L 5 S 1 levels  PROCEDURE:  Procedure(s) with comments: LEFT LUMBAR FOUR- LUMBAR FIVE, LUMBAR FIVE- SACRAL ONE TRANSFORAMINAL LUMBAR INTERBODY FUSION (Left) - LEFT LUMBAR FOUR- LUMBAR FIVE, LUMBAR FIVE- SACRAL ONE TRANSFORAMINAL LUMBAR INTERBODY FUSION with PEEK cages, autograft, BMP, pedicle screw fixation and posterolateral arthrodesis.  Resection of Synovial cyst.    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 It was elected to take her to surgery for decompression and posterior pedicle screw fixation with TLIF at L 45 and L 5 S 1 levels.  Procedure: Patient was brought to the operating room and placed in a prone position on the operating table on Jackson Table using AP and lateral fluoroscopy before incision and to localize after opening.  Her back was prepped with betadine and Duraprep.  Area of incision was infiltrated with local lidocaine.  Bilateral laminae and facets of L 4, L 5, S 1 levels were exposed.  After confirming correct level, left laminae and facets of L 45 and L 5 S 1 were removed.  Bone was saved for bone grafting.  Thorough discectomies  were performed at each level.  Endplates were prepared for later grafting.    thorough decompression of the thecal sac and L 4 , L 5 and S 1 nerve roots with facetectomy at the L 45 and L 5 S 1 on the left.  A synovial cyst was carefully removed at L 5 S 1 on the left.  There was also a herniated disc at this level.  This bone was morselized and used for autograft.  A thorough discectomy was performed and caudally migrated disc fragments were removed.  An 10 x 10 x 25  mm 5 degree lordosis Nuvasive  TLIF implant was packed with  autograft and placed in the interspace at each level along with 10 cc autograft and extra small BMP.  Marland Kitchen pedicle screws were placed using Nuvasive Reline screws. 2 screws were placed at L 4, 5 and S 1 ,(6.5 x 45 mm).  65 mm rods were placed.  And the screws were locked,in situ. Posterolateral region on the right was packed with autograft and allograft.  Long-acting marcaine was injected in the deep musculature.  The wounds were irrigated and then closed with 1, 2-0 and 3-0 Vicryl stitches.  Sterile occlusive dressing was placed with Dermabond and occlusive dressing. The patient was then extubated in the operating room and taken to recovery in stable and satisfactory condition having tolerated her operation well. Counts were correct at the end of the case.  PLAN OF CARE: Admit to inpatient   PATIENT DISPOSITION:  PACU - hemodynamically  stable.   Delay start of Pharmacological VTE agent (>24hrs) due to surgical blood loss or risk of bleeding: yes

## 2017-08-07 NOTE — Transfer of Care (Signed)
Immediate Anesthesia Transfer of Care Note  Patient: Tracey Spencer  Procedure(s) Performed: LEFT LUMBAR FOUR- LUMBAR FIVE, LUMBAR FIVE- SACRAL ONE TRANSFORAMINAL LUMBAR INTERBODY FUSION (Left Back)  Patient Location: PACU  Anesthesia Type:General  Level of Consciousness: lethargic and responds to stimulation  Airway & Oxygen Therapy: Patient Spontanous Breathing and Patient connected to nasal cannula oxygen  Post-op Assessment: Report given to RN and Post -op Vital signs reviewed and stable  Post vital signs: Reviewed and stable  Last Vitals:  Vitals Value Taken Time  BP 158/107 08/07/2017  4:06 PM  Temp    Pulse 90 08/07/2017  4:09 PM  Resp 13 08/07/2017  4:09 PM  SpO2 100 % 08/07/2017  4:09 PM  Vitals shown include unvalidated device data.  Last Pain:  Vitals:   08/07/17 0941  TempSrc:   PainSc: 0-No pain      Patients Stated Pain Goal: 3 (08/07/17 0941)  Complications: No apparent anesthesia complications

## 2017-08-07 NOTE — Brief Op Note (Signed)
08/07/2017  4:03 PM  PATIENT:  Tracey Spencer  66 y.o. female  PRE-OPERATIVE DIAGNOSIS:  SPONDYLOLISTHESIS, LUMBAR REGION, lumbar synovial cyst, lumbar stenosis, herniated lumbar disc, lumbago, radiculopathy L 45 and L 5 S 1 levels  POST-OPERATIVE DIAGNOSIS:  SPONDYLOLISTHESIS, LUMBAR REGION, lumbar synovial cyst, lumbar stenosis, herniated lumbar disc, lumbago, radiculopathy L 45 and L 5 S 1 levels  PROCEDURE:  Procedure(s) with comments: LEFT LUMBAR FOUR- LUMBAR FIVE, LUMBAR FIVE- SACRAL ONE TRANSFORAMINAL LUMBAR INTERBODY FUSION (Left) - LEFT LUMBAR FOUR- LUMBAR FIVE, LUMBAR FIVE- SACRAL ONE TRANSFORAMINAL LUMBAR INTERBODY FUSION with PEEK cages, autograft, BMP, pedicle screw fixation and posterolateral arthrodesis.  Resection of Synovial cyst.    SURGEON:  Surgeon(s) and Role:    * Dilan Novosad, MD - Primary  PHYSICIAN ASSISTANT:   ASSISTANTS: Poteat, RN   ANESTHESIA:   general  EBL:  200 mL   BLOOD ADMINISTERED:none  DRAINS: none   LOCAL MEDICATIONS USED:  MARCAINE    and LIDOCAINE   SPECIMEN:  No Specimen  DISPOSITION OF SPECIMEN:  N/A  COUNTS:  YES  TOURNIQUET:  * No tourniquets in log *   DICTATION: Patient is an 66-year-old with severe spondylosis stenosis and herniated lumbar disc with synovial cyst of the lumbar spine. It was elected to take her to surgery for decompression and posterior pedicle screw fixation with TLIF at L 45 and L 5 S 1 levels.  Procedure: Patient was brought to the operating room and placed in a prone position on the operating table on Jackson Table using AP and lateral fluoroscopy before incision and to localize after opening.  Her back was prepped with betadine and Duraprep.  Area of incision was infiltrated with local lidocaine.  Bilateral laminae and facets of L 4, L 5, S 1 levels were exposed.  After confirming correct level, left laminae and facets of L 45 and L 5 S 1 were removed.  Bone was saved for bone grafting.  Thorough discectomies  were performed at each level.  Endplates were prepared for later grafting.    thorough decompression of the thecal sac and L 4 , L 5 and S 1 nerve roots with facetectomy at the L 45 and L 5 S 1 on the left.  A synovial cyst was carefully removed at L 5 S 1 on the left.  There was also a herniated disc at this level.  This bone was morselized and used for autograft.  A thorough discectomy was performed and caudally migrated disc fragments were removed.  An 10 x 10 x 25  mm 5 degree lordosis Nuvasive  TLIF implant was packed with  autograft and placed in the interspace at each level along with 10 cc autograft and extra small BMP.  . pedicle screws were placed using Nuvasive Reline screws. 2 screws were placed at L 4, 5 and S 1 ,(6.5 x 45 mm).  65 mm rods were placed.  And the screws were locked,in situ. Posterolateral region on the right was packed with autograft and allograft.  Long-acting marcaine was injected in the deep musculature.  The wounds were irrigated and then closed with 1, 2-0 and 3-0 Vicryl stitches.  Sterile occlusive dressing was placed with Dermabond and occlusive dressing. The patient was then extubated in the operating room and taken to recovery in stable and satisfactory condition having tolerated her operation well. Counts were correct at the end of the case.  PLAN OF CARE: Admit to inpatient   PATIENT DISPOSITION:  PACU - hemodynamically   stable.   Delay start of Pharmacological VTE agent (>24hrs) due to surgical blood loss or risk of bleeding: yes

## 2017-08-07 NOTE — Anesthesia Procedure Notes (Signed)
Procedure Name: Intubation Date/Time: 08/07/2017 12:51 PM Performed by: Moshe Salisbury, CRNA Pre-anesthesia Checklist: Patient identified, Emergency Drugs available, Suction available and Patient being monitored Patient Re-evaluated:Patient Re-evaluated prior to induction Oxygen Delivery Method: Circle System Utilized Preoxygenation: Pre-oxygenation with 100% oxygen Induction Type: IV induction Ventilation: Mask ventilation without difficulty Laryngoscope Size: Mac and 3 Grade View: Grade II Tube type: Oral Tube size: 7.5 mm Number of attempts: 1 Airway Equipment and Method: Stylet Placement Confirmation: ETT inserted through vocal cords under direct vision,  positive ETCO2 and breath sounds checked- equal and bilateral Secured at: 21 cm Tube secured with: Tape Dental Injury: Teeth and Oropharynx as per pre-operative assessment

## 2017-08-07 NOTE — Anesthesia Postprocedure Evaluation (Signed)
Anesthesia Post Note  Patient: Tracey Spencer  Procedure(s) Performed: LEFT LUMBAR FOUR- LUMBAR FIVE, LUMBAR FIVE- SACRAL ONE TRANSFORAMINAL LUMBAR INTERBODY FUSION (Left Back)     Patient location during evaluation: PACU Anesthesia Type: General Level of consciousness: awake and alert Pain management: pain level controlled Vital Signs Assessment: post-procedure vital signs reviewed and stable Respiratory status: spontaneous breathing, nonlabored ventilation and respiratory function stable Cardiovascular status: blood pressure returned to baseline and stable Postop Assessment: no apparent nausea or vomiting Anesthetic complications: no    Last Vitals:  Vitals:   08/07/17 1650 08/07/17 1706  BP: (!) 140/92   Pulse: 85   Resp: 13   Temp:  (!) 36.3 C  SpO2: 98%     Last Pain:  Vitals:   08/07/17 1650  TempSrc:   PainSc: 6                  Latonja Bobeck,W. EDMOND

## 2017-08-08 ENCOUNTER — Encounter (HOSPITAL_COMMUNITY): Payer: Self-pay | Admitting: Neurosurgery

## 2017-08-08 ENCOUNTER — Other Ambulatory Visit: Payer: Self-pay

## 2017-08-08 MED ORDER — VITAMIN D 1000 UNITS PO TABS
5000.0000 [IU] | ORAL_TABLET | Freq: Every day | ORAL | Status: DC
  Administered 2017-08-09 – 2017-08-11 (×3): 5000 [IU] via ORAL
  Filled 2017-08-08 (×4): qty 5

## 2017-08-08 MED FILL — Thrombin For Soln 5000 Unit: CUTANEOUS | Qty: 5000 | Status: AC

## 2017-08-08 MED FILL — Sodium Chloride IV Soln 0.9%: INTRAVENOUS | Qty: 1000 | Status: AC

## 2017-08-08 MED FILL — Heparin Sodium (Porcine) Inj 1000 Unit/ML: INTRAMUSCULAR | Qty: 30 | Status: AC

## 2017-08-08 NOTE — Progress Notes (Signed)
Occupational Therapy Evaluation Patient Details Name: Tracey Spencer MRN: [id number] DOB: 1950-10-28 Today's Date: 08/08/2017    History of Present Illness Patient is an 67 year old with severe spondylosis stenosis and herniated lumbar disc with synovial cyst of the lumbar spine. S/p elected TLIF at L 45 and L 5 S 1 levels.   Clinical Impression   PTA, pt was living at home with husband and was modified independent with ADLs due to pain limiting participation. Pt currently modA for ADLs and maxA for IADLs. Pt's husband is unable to provide level of assistance pt requires. Due to decline in current level of function, pt would benefit from acute OT to address establish goals to facilitate safe D/C home. Pt expressed desire to go to SNF for additional rehab prior to return home. At this time, recommend SNF follow-up to increase strength and maximize independence in ADLs and IADLs..      Follow Up Recommendations  SNF    Equipment Recommendations       Recommendations for Other Services PT consult     Precautions / Restrictions Precautions Precautions: Back Precaution Comments: pt has good understanding of back precautions Required Braces or Orthoses: Other Brace/Splint Other Brace/Splint: applied in sitting Restrictions Weight Bearing Restrictions: No      Mobility Bed Mobility Overal bed mobility: Needs Assistance Bed Mobility: Rolling;Sidelying to Sit Rolling: Modified independent (Device/Increase time) Sidelying to sit: Min guard(VC for sequencing)       General bed mobility comments: required extended time   Transfers Overall transfer level: Needs assistance Equipment used: Rolling walker (2 wheeled) Transfers: Sit to/from Stand Sit to Stand: Min guard         General transfer comment: required extended time to ambulate from bed to bathroom. pt used RW for stability and pain management     Balance Overall balance assessment: No apparent balance deficits (not  formally assessed)                                         ADL either performed or assessed with clinical judgement   ADL Overall ADL's : Needs assistance/impaired Eating/Feeding: Set up   Grooming: Minimal assistance(cueing for back precautions)   Upper Body Bathing: Minimal assistance;Sitting   Lower Body Bathing: Moderate assistance;Cueing for back precautions Lower Body Bathing Details (indicate cue type and reason): pt able to return demonstrate use of AE with extended time Upper Body Dressing : Supervision/safety   Lower Body Dressing: Moderate assistance;Adhering to back precautions;Sitting/lateral leans;With adaptive equipment   Toilet Transfer: Supervision/safety;Comfort height toilet;Grab bars;RW;Ambulation;Cueing for safety   Toileting- Clothing Manipulation and Hygiene: Supervision/safety;Cueing for back precautions   Tub/ Shower Transfer: Minimal assistance;Adhering to back precautions;Shower seat   Functional mobility during ADLs: Moderate assistance General ADL Comments: Pt required min VC for back precautions during ADLs(discussed bathing and adhering to back precautions)     Vision         Perception     Praxis      Pertinent Vitals/Pain Pain Assessment: Faces Faces Pain Scale: Hurts even more Pain Location: back Pain Descriptors / Indicators: Discomfort Pain Intervention(s): Monitored during session;Premedicated before session     Hand Dominance Right   Extremity/Trunk Assessment Upper Extremity Assessment Upper Extremity Assessment: Overall WFL for tasks assessed   Lower Extremity Assessment Lower Extremity Assessment: Generalized weakness       Communication Communication Communication: No difficulties   Cognition Arousal/Alertness:  Awake/alert Behavior During Therapy: WFL for tasks assessed/performed Overall Cognitive Status: No family/caregiver present to determine baseline cognitive functioning                                  General Comments: slow processing, decreased problem solving; VC for sequencing   General Comments  pt expressed interest in additional rehab prior to return home; education pt on wear schedule for back brace and importance of mobility; pt demonstrated good understanding of back precautions    Exercises     Shoulder Instructions      Home Living Family/patient expects to be discharged to:: Private residence Living Arrangements: Spouse/significant other Available Help at Discharge: Family;Available PRN/intermittently(husband able to assist with light home management) Type of Home: House             Bathroom Shower/Tub: (walk-in tub (open door to get into))   Bathroom Toilet: Handicapped height     Home Equipment: None          Prior Functioning/Environment Level of Independence: Independent                 OT Problem List: Decreased activity tolerance;Decreased strength;Decreased range of motion;Impaired balance (sitting and/or standing);Pain      OT Treatment/Interventions: Self-care/ADL training;Therapeutic activities;Therapeutic exercise;DME and/or AE instruction;Balance training;Patient/family education    OT Goals(Current goals can be found in the care plan section) Acute Rehab OT Goals Patient Stated Goal: to get better OT Goal Formulation: With patient Time For Goal Achievement: 08/22/17 Potential to Achieve Goals: Good  OT Frequency: Min 2X/week   Barriers to D/C: Decreased caregiver support  husband unable to provided level of assist pt requires       Co-evaluation              AM-PAC PT "6 Clicks" Daily Activity     Outcome Measure Help from another person eating meals?: None Help from another person taking care of personal grooming?: A Lot Help from another person toileting, which includes using toliet, bedpan, or urinal?: A Little Help from another person bathing (including washing, rinsing, drying)?: A  Little Help from another person to put on and taking off regular upper body clothing?: A Little Help from another person to put on and taking off regular lower body clothing?: A Lot 6 Click Score: 17   End of Session Equipment Utilized During Treatment: Gait belt;Rolling walker;Back brace Nurse Communication: Mobility status  Activity Tolerance: Patient tolerated treatment well Patient left: in chair;with call bell/phone within reach  OT Visit Diagnosis: Unsteadiness on feet (R26.81);Muscle weakness (generalized) (M62.81)                Time: 1610-9604 OT Time Calculation (min): 46 min Charges:    G-Codes:     Diona Browner OTS   08/08/2017, 10:09 AM

## 2017-08-08 NOTE — Evaluation (Signed)
Physical Therapy Evaluation Patient Details Name: Tracey Spencer MRN: [id number] DOB: Nov 21, 1950 Today's Date: 08/08/2017   History of Present Illness  Patient is an 67 year old with severe spondylosis stenosis and herniated lumbar disc with synovial cyst of the lumbar spine. Pt is now s/p L4-S1 TLIF on 08/07/17. PMH significant for nonischemic cardiomyopathy, HTN, cardiac arrhythmia s/p radiofrequency ablation.  Clinical Impression  Pt admitted with above diagnosis. Pt currently with functional limitations due to the deficits listed below (see PT Problem List). At the time of PT eval pt was able to perform transfers and ambulation with hands-on guarding for balance support and safety with the RW. Overall pt progressing through transfers very slowly and requires increased time for all aspects of mobility. Pt was educated on activity progression and recommended pt ambulate with nursing staff in hall at least 2 more times today. Pt reports that she will not have adequate support at home and her husband is not able to physically assist her at this time. Recommending follow-up therapies at the SNF level to maximize functional independence and safety with return home. Acutely, pt will benefit from skilled PT to increase their independence and safety with mobility to allow discharge to the venue listed below.       Follow Up Recommendations SNF;Supervision/Assistance - 24 hour    Equipment Recommendations  Rolling walker with 5" wheels    Recommendations for Other Services       Precautions / Restrictions Precautions Precautions: Back;Fall Precaution Booklet Issued: Yes (comment) Precaution Comments: Reviewed precautions verbally and pt was able to recall 3/3 from OT session this morning. Pt was cued for precautions during functional mobility.  Required Braces or Orthoses: Spinal Brace Spinal Brace: Lumbar corset;Applied in sitting position Other Brace/Splint: applied in  sitting Restrictions Weight Bearing Restrictions: No      Mobility  Bed Mobility Overal bed mobility: Needs Assistance Bed Mobility: Sidelying to Sit;Sit to Sidelying Rolling: Modified independent (Device/Increase time) Sidelying to sit: Min guard     Sit to sidelying: Min guard General bed mobility comments: Increased time and heavy use of rails for scooting/positioning in bed. Pt was cued for proper log roll technique and hands on guarding provided for trunk mobility and elevating LE's up into bed.   Transfers Overall transfer level: Needs assistance Equipment used: Rolling walker (2 wheeled) Transfers: Sit to/from Stand Sit to Stand: Min guard         General transfer comment: VC's for hand placement on seated surface for safety. No assist required however hands-on guarding provided for safety.   Ambulation/Gait Ambulation/Gait assistance: Min guard Ambulation Distance (Feet): 150 Feet Assistive device: Rolling walker (2 wheeled) Gait Pattern/deviations: Step-through pattern;Decreased stride length;Trunk flexed Gait velocity: Decreased Gait velocity interpretation: <1.8 ft/sec, indicate of risk for recurrent falls General Gait Details: Initially pt moving extremely slow and taking a standing rest break every ~3 steps. With cues to improve posture and keep a quicker pace, pt was able to decrease need for rest breaks and demonstrate a smoother gait pattern with an increased step/stride length.  Stairs            Wheelchair Mobility    Modified Rankin (Stroke Patients Only)       Balance Overall balance assessment: No apparent balance deficits (not formally assessed)  Pertinent Vitals/Pain Pain Assessment: Faces Faces Pain Scale: Hurts even more Pain Location: back Pain Descriptors / Indicators: Discomfort Pain Intervention(s): Monitored during session;Premedicated before session    Home Living  Family/patient expects to be discharged to:: Private residence Living Arrangements: Spouse/significant other Available Help at Discharge: Family;Available PRN/intermittently(husband able to assist with light home management) Type of Home: House         Home Equipment: None      Prior Function Level of Independence: Independent               Hand Dominance   Dominant Hand: Right    Extremity/Trunk Assessment   Upper Extremity Assessment Upper Extremity Assessment: Defer to OT evaluation    Lower Extremity Assessment Lower Extremity Assessment: Generalized weakness    Cervical / Trunk Assessment Cervical / Trunk Assessment: Other exceptions Cervical / Trunk Exceptions: s/p surgery. Noted forward head posture with rounded shoulders in sitting and standing.   Communication   Communication: No difficulties  Cognition Arousal/Alertness: Awake/alert Behavior During Therapy: WFL for tasks assessed/performed Overall Cognitive Status: No family/caregiver present to determine baseline cognitive functioning                                 General Comments: slow processing, decreased problem solving; VC for sequencing      General Comments General comments (skin integrity, edema, etc.): pt expressed interest in additional rehab prior to return home; education pt on wear schedule for back brace and importance of mobility; pt demonstrated good understanding of back precautions    Exercises     Assessment/Plan    PT Assessment Patient needs continued PT services  PT Problem List Decreased strength;Decreased range of motion;Decreased activity tolerance;Decreased balance;Decreased mobility;Decreased knowledge of use of DME;Decreased safety awareness;Decreased knowledge of precautions;Pain       PT Treatment Interventions DME instruction;Gait training;Stair training;Functional mobility training;Therapeutic activities;Therapeutic exercise;Neuromuscular  re-education;Patient/family education    PT Goals (Current goals can be found in the Care Plan section)  Acute Rehab PT Goals Patient Stated Goal: to get better PT Goal Formulation: With patient Time For Goal Achievement: 08/22/17 Potential to Achieve Goals: Good    Frequency Min 5X/week   Barriers to discharge Decreased caregiver support Pt reports her husband is not able to physically assist her at home.    Co-evaluation               AM-PAC PT "6 Clicks" Daily Activity  Outcome Measure Difficulty turning over in bed (including adjusting bedclothes, sheets and blankets)?: A Little Difficulty moving from lying on back to sitting on the side of the bed? : Unable Difficulty sitting down on and standing up from a chair with arms (e.g., wheelchair, bedside commode, etc,.)?: A Little Help needed moving to and from a bed to chair (including a wheelchair)?: A Little Help needed walking in hospital room?: A Little Help needed climbing 3-5 steps with a railing? : A Lot 6 Click Score: 15    End of Session Equipment Utilized During Treatment: Gait belt;Back brace Activity Tolerance: Patient limited by pain;Patient limited by fatigue Patient left: in bed;with call bell/phone within reach Nurse Communication: Mobility status PT Visit Diagnosis: Unsteadiness on feet (R26.81);Pain;Other symptoms and signs involving the nervous system (R29.898) Pain - part of body: (back)    Time: 1050-1120 PT Time Calculation (min) (ACUTE ONLY): 30 min   Charges:   PT Evaluation $PT Eval Moderate Complexity: 1 Mod  PT Treatments $Gait Training: 8-22 mins   PT G Codes:        Conni Slipper, PT, DPT Acute Rehabilitation Services Pager: [phone]   Marylynn Pearson 08/08/2017, 11:48 AM

## 2017-08-08 NOTE — Progress Notes (Addendum)
Subjective: Patient reports "I'm hurting some in my back and a little in my legs" "I'm moving some...not as fast as they want me to"  Objective: Vital signs in last 24 hours: Temp:  [97.3 F (36.3 C)-98.7 F (37.1 C)] 98.1 F (36.7 C) (05/29 0707) Pulse Rate:  [59-92] 62 (05/29 0707) Resp:  [7-18] 16 (05/29 0707) BP: (99-166)/(64-107) 117/70 (05/29 0707) SpO2:  [97 %-100 %] 100 % (05/29 0707) Weight:  [87.1 kg (192 lb 1.6 oz)] 87.1 kg (192 lb 1.6 oz) (05/28 0174)  Intake/Output from previous day: 05/28 0701 - 05/29 0700 In: 1100 [I.V.:1100] Out: 2085 [Urine:1885; Blood:200] Intake/Output this shift: No intake/output data recorded.  Alert, conversant. Incision without erythema, swelling, or drainage beneath honeycomb and Dermabond. Good strength BLE. Ambulated last night, apprehensive.  Lab Results: No results for input(s): WBC, HGB, HCT, PLT in the last 72 hours. BMET No results for input(s): NA, K, CL, CO2, GLUCOSE, BUN, CREATININE, CALCIUM in the last 72 hours.  Studies/Results: Dg Lumbar Spine Complete  Result Date: 08/07/2017 CLINICAL DATA:  Lumbar fusion EXAM: DG C-ARM 61-120 MIN; LUMBAR SPINE - COMPLETE 4+ VIEW COMPARISON:  None. FLUOROSCOPY TIME:  Fluoroscopy Time:  49 seconds Radiation Exposure Index (if provided by the fluoroscopic device): Not available Number of Acquired Spot Images: 4 FINDINGS: Interbody fusion at L4-5 and L5-S1 is noted with pedicle screws in place. The posterior fixation elements have not been placed. IMPRESSION: Intraoperative lumbar fusion Electronically Signed   By: Alcide Clever M.D.   On: 08/07/2017 15:44   Dg C-arm 1-60 Min  Result Date: 08/07/2017 CLINICAL DATA:  Lumbar fusion EXAM: DG C-ARM 61-120 MIN; LUMBAR SPINE - COMPLETE 4+ VIEW COMPARISON:  None. FLUOROSCOPY TIME:  Fluoroscopy Time:  49 seconds Radiation Exposure Index (if provided by the fluoroscopic device): Not available Number of Acquired Spot Images: 4 FINDINGS: Interbody fusion at  L4-5 and L5-S1 is noted with pedicle screws in place. The posterior fixation elements have not been placed. IMPRESSION: Intraoperative lumbar fusion Electronically Signed   By: Alcide Clever M.D.   On: 08/07/2017 15:44    Assessment/Plan: Improving  LOS: 1 day  Mobilize in brace as tolerated.    Georgiann Cocker 08/08/2017, 7:15 AM   Mobilize with PT.  Doing well.

## 2017-08-08 NOTE — Care Management Note (Signed)
Case Management Note  Patient Details  Name: Tracey Spencer MRN: [id number] Date of Birth: [birth year]  Subjective/Objective:  Patient is an 67 year old with severe spondylosis stenosis and herniated lumbar disc with synovial cyst of the lumbar spine. Pt is now s/p L4-S1 TLIF on 08/07/17.   PTA, pt independent, lives at home with spouse.                  Action/Plan: PT recommending SNF for short term rehab at dc.  Will consult CSW to facilitate possible dc to SNF upon medical stability.  Expected Discharge Date:                  Expected Discharge Plan:  Skilled Nursing Facility  In-House Referral:  Clinical Social Work  Discharge planning Services  CM Consult  Post Acute Care Choice:    Choice offered to:     DME Arranged:    DME Agency:     HH Arranged:    HH Agency:     Status of Service:  In process, will continue to follow  If discussed at Long Length of Stay Meetings, dates discussed:    Additional Comments:  Quintella Baton, RN, BSN  Trauma/Neuro ICU Case Manager [phone]

## 2017-08-08 NOTE — Progress Notes (Signed)
RT placed CPAP on patient. Patient tolerating well at this time with stable breath sounds and no respiratory distress noted. RT will monitor patient as needed.

## 2017-08-08 NOTE — [place] FL2 (Signed)
[place] MEDICAID FL2 LEVEL OF CARE SCREENING TOOL     IDENTIFICATION  Patient Name: Tracey Spencer Birthdate: [birth year] Sex: female Admission Date (Current Location): 08/07/2017  [facility] and [place] Number:  Producer, television/film/video and Address:  [facility], [address], [place], [place] [address]      Provider Number: [id number]  Attending Physician Name and Address:  Maeola Harman, MD  Relative Name and Phone Number:  Elissia Spiewak; spouse; [phone]    Current Level of Care: Hospital Recommended Level of Care: Skilled Nursing Facility Prior Approval Number:    Date Approved/Denied:   PASRR Number:    Discharge Plan: SNF    Current Diagnoses: Patient Active Problem List   Diagnosis Date Noted  . Spondylolisthesis of lumbar region 08/07/2017    Orientation RESPIRATION BLADDER Height & Weight     Self, Time, Situation, Place  Normal Continent Weight: 192 lb 1.6 oz (87.1 kg) Height:  5\' 2"  (157.5 cm)  BEHAVIORAL SYMPTOMS/MOOD NEUROLOGICAL BOWEL NUTRITION STATUS      Continent Diet(see discharge summary)  AMBULATORY STATUS COMMUNICATION OF NEEDS Skin   Limited Assist Verbally Surgical wounds(honeycomb dressing on back incision)                       Personal Care Assistance Level of Assistance  Bathing, Feeding, Dressing Bathing Assistance: Maximum assistance Feeding assistance: Independent Dressing Assistance: Limited assistance     Functional Limitations Info  Sight, Hearing, Speech Sight Info: Adequate Hearing Info: Adequate Speech Info: Adequate    SPECIAL CARE FACTORS FREQUENCY  PT (By licensed PT), OT (By licensed OT)     PT Frequency: 5x week OT Frequency: 5x week            Contractures Contractures Info: Not present    Additional Factors Info  Code Status, Allergies Code Status Info: Full Code Allergies Info: Oxycodone           Current Medications (08/08/2017):  This is the current hospital  active medication list Current Facility-Administered Medications  Medication Dose Route Frequency Provider Last Rate Last Dose  . acetaminophen (TYLENOL) tablet 650 mg  650 mg Oral Q4H PRN Maeola Harman, MD       Or  . acetaminophen (TYLENOL) suppository 650 mg  650 mg Rectal Q4H PRN Maeola Harman, MD      . alum & mag hydroxide-simeth (MAALOX/MYLANTA) 200-200-20 MG/5ML suspension 30 mL  30 mL Oral Q6H PRN Maeola Harman, MD      . atorvastatin (LIPITOR) tablet 40 mg  40 mg Oral q1800 Maeola Harman, MD   40 mg at 08/07/17 2031  . bisacodyl (DULCOLAX) suppository 10 mg  10 mg Rectal Daily PRN Maeola Harman, MD      . carvedilol (COREG) tablet 6.25 mg  6.25 mg Oral BID WC Maeola Harman, MD   6.25 mg at 08/08/17 2952  . cholecalciferol (VITAMIN D) tablet 5,000 Units  5,000 Units Oral Daily Maeola Harman, MD      . dextrose 5 % and 0.45 % NaCl with KCl 20 mEq/L infusion   Intravenous Continuous Maeola Harman, MD      . docusate sodium (COLACE) capsule 100 mg  100 mg Oral BID Maeola Harman, MD   100 mg at 08/08/17 8413  . famotidine (PEPCID) tablet 20 mg  20 mg Oral BID Maeola Harman, MD   20 mg at 08/08/17 2440  . HYDROcodone-acetaminophen (NORCO) 7.5-325 MG per tablet 2 tablet  2  tablet Oral Q4H PRN Maeola Harman, MD   2 tablet at 08/08/17 [phone]  . HYDROcodone-acetaminophen (NORCO/VICODIN) 5-325 MG per tablet 1 tablet  1 tablet Oral Q4H PRN Maeola Harman, MD   1 tablet at 08/08/17 1514  . hydrOXYzine (VISTARIL) injection 50 mg  50 mg Intramuscular Q6H PRN Maeola Harman, MD   50 mg at 08/07/17 1844  . lisinopril (PRINIVIL,ZESTRIL) tablet 20 mg  20 mg Oral Daily Maeola Harman, MD   20 mg at 08/07/17 2032  . menthol-cetylpyridinium (CEPACOL) lozenge 3 mg  1 lozenge Oral PRN Maeola Harman, MD       Or  . phenol [facility]) mouth spray 1 spray  1 spray Mouth/Throat PRN Maeola Harman, MD      . methocarbamol (ROBAXIN) tablet 500 mg  500 mg Oral Q6H PRN Maeola Harman, MD   500 mg at 08/08/17 1514   Or   . methocarbamol (ROBAXIN) 500 mg in dextrose 5 % 50 mL IVPB  500 mg Intravenous Q6H PRN Maeola Harman, MD      . morphine 4 MG/ML injection 2 mg  2 mg Intravenous Q2H PRN Maeola Harman, MD   2 mg at 08/07/17 1842  . ondansetron (ZOFRAN) tablet 4 mg  4 mg Oral Q6H PRN Maeola Harman, MD       Or  . ondansetron [facility]) injection 4 mg  4 mg Intravenous Q6H PRN Maeola Harman, MD   4 mg at 08/07/17 1631  . polyethylene glycol (MIRALAX / GLYCOLAX) packet 17 g  17 g Oral Daily PRN Maeola Harman, MD      . sodium phosphate (FLEET) 7-19 GM/118ML enema 1 enema  1 enema Rectal Once PRN Maeola Harman, MD      . zolpidem [facility]) tablet 5 mg  5 mg Oral QHS PRN Maeola Harman, MD         Discharge Medications: Please see discharge summary for a list of discharge medications.  Relevant Imaging Results:  Relevant Lab Results:   Additional Information SS# [id number] [place], [place]

## 2017-08-08 NOTE — Social Work (Signed)
CSW completed FL2, pt from [place] so no PASSR needed unless pt decides to discharge to SNF in [place]. Unsure if pt's BCBS includes SNF benefits, will continue to follow and present offers when available.  Doy Hutching, LCSWA [facility] Clinical Social Work [phone]

## 2017-08-09 MED ORDER — DEXAMETHASONE 4 MG PO TABS
4.0000 mg | ORAL_TABLET | Freq: Four times a day (QID) | ORAL | Status: DC
  Administered 2017-08-09 – 2017-08-11 (×10): 4 mg via ORAL
  Filled 2017-08-09 (×10): qty 1

## 2017-08-09 NOTE — Progress Notes (Signed)
Patient ID: Tracey Spencer, female   DOB: Feb 03, 1951, 67 y.o.   MRN: [id number] Subjective: Patient reports L low back pain and L hip pain, no NTW  Objective: Vital signs in last 24 hours: Temp:  [98.1 F (36.7 C)-98.9 F (37.2 C)] 98.9 F (37.2 C) (05/30 0409) Pulse Rate:  [62-70] 69 (05/30 0409) Resp:  [16] 16 (05/29 2341) BP: (104-130)/(63-84) 108/66 (05/30 0409) SpO2:  [90 %-100 %] 90 % (05/30 0409) FiO2 (%):  [21 %] 21 % (05/29 2228)  Intake/Output from previous day: 05/29 0701 - 05/30 0700 In: 360 [P.O.:360] Out: 700 [Urine:700] Intake/Output this shift: No intake/output data recorded.  Neurologic: Grossly normal  Lab Results: Lab Results  Component Value Date   WBC 3.1 (L) 07/30/2017   HGB 14.1 07/30/2017   HCT 43.5 07/30/2017   MCV 88.4 07/30/2017   PLT 204 07/30/2017   No results found for: INR, PROTIME BMET Lab Results  Component Value Date   NA 141 07/30/2017   K 3.7 07/30/2017   CL 103 07/30/2017   CO2 30 07/30/2017   GLUCOSE 89 07/30/2017   BUN 11 07/30/2017   CREATININE 0.74 07/30/2017   CALCIUM 9.3 07/30/2017    Studies/Results: Dg Lumbar Spine Complete  Result Date: 08/07/2017 CLINICAL DATA:  Lumbar fusion EXAM: DG C-ARM 61-120 MIN; LUMBAR SPINE - COMPLETE 4+ VIEW COMPARISON:  None. FLUOROSCOPY TIME:  Fluoroscopy Time:  49 seconds Radiation Exposure Index (if provided by the fluoroscopic device): Not available Number of Acquired Spot Images: 4 FINDINGS: Interbody fusion at L4-5 and L5-S1 is noted with pedicle screws in place. The posterior fixation elements have not been placed. IMPRESSION: Intraoperative lumbar fusion Electronically Signed   By: Alcide Clever M.D.   On: 08/07/2017 15:44   Dg C-arm 1-60 Min  Result Date: 08/07/2017 CLINICAL DATA:  Lumbar fusion EXAM: DG C-ARM 61-120 MIN; LUMBAR SPINE - COMPLETE 4+ VIEW COMPARISON:  None. FLUOROSCOPY TIME:  Fluoroscopy Time:  49 seconds Radiation Exposure Index (if provided by the fluoroscopic  device): Not available Number of Acquired Spot Images: 4 FINDINGS: Interbody fusion at L4-5 and L5-S1 is noted with pedicle screws in place. The posterior fixation elements have not been placed. IMPRESSION: Intraoperative lumbar fusion Electronically Signed   By: Alcide Clever M.D.   On: 08/07/2017 15:44    Assessment/Plan: Doing ok, more sore today, mobilize today  Estimated body mass index is 35.14 kg/m as calculated from the following:   Height as of this encounter: 5\' 2"  (1.575 m).   Weight as of this encounter: 87.1 kg (192 lb 1.6 oz).    LOS: 2 days    Tracey Spencer 08/09/2017, 7:55 AM

## 2017-08-09 NOTE — Progress Notes (Signed)
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 S/p elected TLIF at L 45 and L 5 S 1 levels.    PT Comments    Though pt is progressing, she has new and mildly progressing pain in the L hip and leg.  Emphasis on education, bed mobility, transfer safety and progressing gait/stability and speed.    Follow Up Recommendations  SNF;Supervision/Assistance - 24 hour     Equipment Recommendations  Rolling walker with 5" wheels    Recommendations for Other Services       Precautions / Restrictions Precautions Precautions: Back;Fall Precaution Booklet Issued: Yes (comment) Precaution Comments: Reviewed precautions verbally and pt was able to recall 3/3 from OT session this morning. Pt was cued for precautions during functional mobility.  Required Braces or Orthoses: Spinal Brace Spinal Brace: Lumbar corset;Applied in sitting position Other Brace/Splint: applied in sitting    Mobility  Bed Mobility Overal bed mobility: Needs Assistance Bed Mobility: Rolling;Sidelying to Sit;Sit to Sidelying Rolling: Modified independent (Device/Increase time) Sidelying to sit: Min guard     Sit to sidelying: Min assist General bed mobility comments: increased time and heavy use of the rails.  More trouble attempting from the L  (sore) side.  Transfers Overall transfer level: Needs assistance Equipment used: Rolling walker (2 wheeled) Transfers: Sit to/from Stand Sit to Stand: Min assist         General transfer comment: vc's of best hand placement and more stability assist due to the left sided pain.  Ambulation/Gait Ambulation/Gait assistance: Min guard Ambulation Distance (Feet): 150 Feet Assistive device: Rolling walker (2 wheeled) Gait Pattern/deviations:  Step-through pattern;Decreased stride length Gait velocity: Decreased Gait velocity interpretation: <1.8 ft/sec, indicate of risk for recurrent falls General Gait Details: verbal and tactile cues to improve speed, limited by pain.  Generally steady with RW despite the pain and short step length.   Stairs             Wheelchair Mobility    Modified Rankin (Stroke Patients Only)       Balance Overall balance assessment: No apparent balance deficits (not formally assessed)                                          Cognition Arousal/Alertness: Awake/alert Behavior During Therapy: WFL for tasks assessed/performed Overall Cognitive Status: Within Functional Limits for tasks assessed(though not tested formally, participated well in education)                                 General Comments: mildly slower processing      Exercises      General Comments General comments (skin integrity, edema, etc.): reinforced back precautions, log roll/transition to sit, lifting restrictions, donning brace/bracing issues and progression of activity.      Pertinent Vitals/Pain Pain Assessment: Faces Faces Pain Scale: Hurts even more Pain Location: back, left hip and leg Pain Descriptors / Indicators: Discomfort Pain Intervention(s): Monitored during session    Home Living                      Prior Function  PT Goals (current goals can now be found in the care plan section) Acute Rehab PT Goals Patient Stated Goal: to get better PT Goal Formulation: With patient Time For Goal Achievement: 08/22/17 Potential to Achieve Goals: Good Progress towards PT goals: Progressing toward goals    Frequency    Min 5X/week      PT Plan Current plan remains appropriate    Co-evaluation              AM-PAC PT "6 Clicks" Daily Activity  Outcome Measure  Difficulty turning over in bed (including adjusting bedclothes, sheets and  blankets)?: A Little Difficulty moving from lying on back to sitting on the side of the bed? : Unable Difficulty sitting down on and standing up from a chair with arms (e.g., wheelchair, bedside commode, etc,.)?: Unable Help needed moving to and from a bed to chair (including a wheelchair)?: A Little Help needed walking in hospital room?: A Little Help needed climbing 3-5 steps with a railing? : A Lot 6 Click Score: 13    End of Session Equipment Utilized During Treatment: Back brace Activity Tolerance: Patient limited by pain;Patient limited by fatigue Patient left: in chair;with call bell/phone within reach;with family/visitor present Nurse Communication: Mobility status PT Visit Diagnosis: Unsteadiness on feet (R26.81);Other symptoms and signs involving the nervous system (R29.898);Pain Pain - Right/Left: Left Pain - part of body: Hip;Leg     Time: 1610-9604 PT Time Calculation (min) (ACUTE ONLY): 38 min  Charges:  $Gait Training: 8-22 mins $Therapeutic Activity: 8-22 mins $Self Care/Home Management: 8-22                    G Codes:       2017/08/17  Seguin [name], PT [phone]  (pager)   [name] Madden Garron August 17, 2017, 5:18 PM

## 2017-08-09 NOTE — Clinical Social Work Note (Signed)
Clinical Social Work Assessment  Patient Details  Name: Tracey Spencer MRN: [id number] Date of Birth: 1950/11/17  Date of referral:  08/09/17               Reason for consult:  Facility Placement                Permission sought to share information with:  Facility Medical sales representative, Family Supports Permission granted to share information::  Yes, Verbal Permission Granted  Name::        Agency::  SNFs  Relationship::     Contact Information:     Housing/Transportation Living arrangements for the past 2 months:  Single Family Home Source of Information:  Patient Patient Interpreter Needed:  None Criminal Activity/Legal Involvement Pertinent to Current Situation/Hospitalization:  No - Comment as needed Significant Relationships:  Adult Children, Spouse Lives with:  Spouse Do you feel safe going back to the place where you live?  No Need for family participation in patient care:  No (Coment)  Care giving concerns:  CSW received consult for possible SNF placement at time of discharge. CSW spoke with patient regarding PT recommendation of SNF placement at time of discharge. Patient reported that patient's spouse is currently unable to care for patient at their home given patient's current physical needs and fall risk. Patient expressed understanding of PT recommendation and is agreeable to SNF placement at time of discharge. CSW to continue to follow and assist with discharge planning needs.   Social Worker assessment / plan:  CSW spoke with patient concerning possibility of rehab at [facility] before returning home.  Employment status:  Retired Wellsite geologist, [employer] PT Recommendations:  Skilled Nursing Facility Information / Referral to community resources:  Skilled Nursing Facility  Patient/Family's Response to care:  Patient recognizes need for rehab before returning home and is agreeable to a SNF in [place]. Patient reported preference for [facility]. They will begin insurance authorization. Note, BCBS closed on the weekend.   Patient/Family's Understanding of and Emotional Response to Diagnosis, Current Treatment, and Prognosis:  Patient/family is realistic regarding therapy needs and expressed being hopeful for SNF placement. Patient expressed understanding of CSW role and discharge process as well as medical condition. No questions/concerns about plan or treatment.    Emotional Assessment Appearance:  Appears stated age Attitude/Demeanor/Rapport:  Engaged Affect (typically observed):  Accepting, Appropriate, Pleasant Orientation:  Oriented to Self, Oriented to Situation, Oriented to Place, Oriented to  Time Alcohol / Substance use:  Not Applicable Psych involvement (Current and /or in the community):  No (Comment)  Discharge Needs  Concerns to be addressed:  Care Coordination Readmission within the last 30 days:  No Current discharge risk:  None Barriers to Discharge:  Continued Medical Work up   [employer], LCSWA 08/09/2017, 10:14 AM

## 2017-08-10 NOTE — Progress Notes (Signed)
Subjective: Patient reports continued back pain radiating into the left buttock and down the lateral part of the left thigh. It is worse with putting weight on the leg. No new numbness tingling weakness or bowel or bladder changes.  Objective: Vital signs in last 24 hours: Temp:  [97.6 F (36.4 C)-98.4 F (36.9 C)] 97.8 F (36.6 C) (05/31 0849) Pulse Rate:  [60-76] 62 (05/31 0849) Resp:  [17] 17 (05/30 1159) BP: (88-157)/(53-78) 157/77 (05/31 0849) SpO2:  [95 %-97 %] 97 % (05/31 0849)  Intake/Output from previous day: 05/30 0701 - 05/31 0700 In: 720 [P.O.:720] Out: -  Intake/Output this shift: No intake/output data recorded.  Neurologic: Grossly normal, walks okay with her walker with a slight limp on the left  Lab Results: Lab Results  Component Value Date   WBC 3.1 (L) 07/30/2017   HGB 14.1 07/30/2017   HCT 43.5 07/30/2017   MCV 88.4 07/30/2017   PLT 204 07/30/2017   No results found for: INR, PROTIME BMET Lab Results  Component Value Date   NA 141 07/30/2017   K 3.7 07/30/2017   CL 103 07/30/2017   CO2 30 07/30/2017   GLUCOSE 89 07/30/2017   BUN 11 07/30/2017   CREATININE 0.74 07/30/2017   CALCIUM 9.3 07/30/2017    Studies/Results: No results found.  Assessment/Plan: Is to have some left leg pain which I suspect is a radiculitis. She is walking okay. Continue medical management and await nursing home placement  Estimated body mass index is 35.14 kg/m as calculated from the following:   Height as of this encounter: 5\' 2"  (1.575 m).   Weight as of this encounter: 87.1 kg (192 lb 1.6 oz).    LOS: 3 days    Aissa Lisowski S 08/10/2017, 10:09 AM

## 2017-08-10 NOTE — Progress Notes (Signed)
[facility] in [place] has SNF authorization and can accept patient over weekend.  Osborne Casco Tayvian Holycross LCSW [phone]

## 2017-08-10 NOTE — Progress Notes (Signed)
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 S/p elected TLIF at L 45 and L 5 S 1 levels.    PT Comments    Improving well, but slowly, limited by pain.  Emphasis on education and gait training.    Follow Up Recommendations  SNF;Supervision/Assistance - 24 hour     Equipment Recommendations  Rolling walker with 5" wheels    Recommendations for Other Services       Precautions / Restrictions Precautions Precautions: Back;Fall Precaution Booklet Issued: Yes (comment) Precaution Comments: Reviewed precautions verbally and pt was able to recall 3/3 from OT session this morning. Pt was cued for precautions during functional mobility.  Required Braces or Orthoses: Spinal Brace Spinal Brace: Lumbar corset;Applied in sitting position Other Brace/Splint: applied in sitting    Mobility  Bed Mobility Overal bed mobility: Needs Assistance Bed Mobility: Rolling;Sit to Sidelying Rolling: Modified independent (Device/Increase time)       Sit to sidelying: Min assist General bed mobility comments: increased time and LE effort  Transfers Overall transfer level: Needs assistance Equipment used: Rolling walker (2 wheeled) Transfers: Sit to/from Stand Sit to Stand: Min assist         General transfer comment: vc's of best hand placement and more stability assist due to the left sided pain.  Ambulation/Gait Ambulation/Gait assistance: Min guard Ambulation Distance (Feet): 160 Feet Assistive device: Rolling walker (2 wheeled) Gait Pattern/deviations: Step-through pattern Gait velocity: Decreased   General Gait Details: verbal and tactile cues to improve speed, limited by pain.  Generally steady with RW despite the pain and short step  length.   Stairs             Wheelchair Mobility    Modified Rankin (Stroke Patients Only)       Balance Overall balance assessment: No apparent balance deficits (not formally assessed)                                          Cognition Arousal/Alertness: Awake/alert Behavior During Therapy: WFL for tasks assessed/performed Overall Cognitive Status: Within Functional Limits for tasks assessed                                        Exercises      General Comments General comments (skin integrity, edema, etc.): Pt's son witnessed session and in on education.reinforced back precautions, log roll/transition to sit, lifting restrictions, donning brace/bracing issues and progression of activity.      Pertinent Vitals/Pain Pain Assessment: Faces Faces Pain Scale: Hurts even more Pain Location: back, left hip and leg Pain Descriptors / Indicators: Discomfort Pain Intervention(s): Monitored during session    Home Living                      Prior Function            PT Goals (current goals can now be found in the care plan section) Acute Rehab PT Goals Patient Stated Goal: to get better PT Goal Formulation: With patient Time For Goal Achievement: 08/22/17 Potential to Achieve Goals: Good Progress towards PT goals:  Progressing toward goals    Frequency    Min 5X/week      PT Plan Current plan remains appropriate    Co-evaluation              AM-PAC PT "6 Clicks" Daily Activity  Outcome Measure  Difficulty turning over in bed (including adjusting bedclothes, sheets and blankets)?: A Little Difficulty moving from lying on back to sitting on the side of the bed? : Unable Difficulty sitting down on and standing up from a chair with arms (e.g., wheelchair, bedside commode, etc,.)?: Unable Help needed moving to and from a bed to chair (including a wheelchair)?: A Little Help needed walking in hospital room?:  A Little Help needed climbing 3-5 steps with a railing? : A Lot 6 Click Score: 13    End of Session Equipment Utilized During Treatment: Back brace Activity Tolerance: Patient limited by pain Patient left: in bed;with call bell/phone within reach;with family/visitor present Nurse Communication: Mobility status PT Visit Diagnosis: Unsteadiness on feet (R26.81);Other symptoms and signs involving the nervous system (R29.898) Pain - Right/Left: Left Pain - part of body: Hip;Leg     Time: 1010-1036 PT Time Calculation (min) (ACUTE ONLY): 26 min  Charges:  $Gait Training: 8-22 mins $Therapeutic Activity: 8-22 mins                    G Codes:       08/13/2017   [name], PT [phone]  (pager)   [name] [name] Aug 13, 2017, 10:43 AM

## 2017-08-11 MED ORDER — HYDROCODONE-ACETAMINOPHEN 7.5-325 MG PO TABS: 1.0000 | ORAL_TABLET | ORAL | 0 refills | Status: AC | PRN

## 2017-08-11 NOTE — Progress Notes (Signed)
CSW confirmed that all information has been received per Rivka Barbara at [facility].   Claude Manges Kennard Fildes, MSW, LCSW-A Emergency Department Clinical Social Worker [phone]

## 2017-08-11 NOTE — Progress Notes (Signed)
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 S/p elected TLIF at L 45 and L 5 S 1 levels.    PT Comments    Improving slow, L sided pain a mild limitation.  Emphasis on education, bed mobility and gait stamina.    Follow Up Recommendations  SNF;Supervision/Assistance - 24 hour     Equipment Recommendations  Rolling walker with 5" wheels    Recommendations for Other Services       Precautions / Restrictions Precautions Precautions: Back;Fall Precaution Booklet Issued: Yes (comment) Precaution Comments: Reviewed precautions verbally and pt was able to recall 3/3  Required Braces or Orthoses: Spinal Brace Spinal Brace: Lumbar corset;Applied in sitting position Other Brace/Splint: applied in sitting Restrictions Weight Bearing Restrictions: No    Mobility  Bed Mobility Overal bed mobility: Needs Assistance Bed Mobility: Rolling;Sit to Sidelying Rolling: Modified independent (Device/Increase time) Sidelying to sit: Min guard       General bed mobility comments: slow with some effort, but not assist  Transfers Overall transfer level: Needs assistance Equipment used: Rolling walker (2 wheeled) Transfers: Sit to/from Stand Sit to Stand: Min assist         General transfer comment: cues for hand placement and getting scooting closer to the edge, monor stability assist  Ambulation/Gait Ambulation/Gait assistance: Min guard Ambulation Distance (Feet): 180 Feet Assistive device: Rolling walker (2 wheeled) Gait Pattern/deviations: Step-through pattern Gait velocity: Decreased Gait velocity interpretation: 1.31 - 2.62 ft/sec, indicative of limited 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      Pertinent Vitals/Pain Pain Assessment: Faces Faces Pain Scale: Hurts little more Pain Location: back, left hip and leg Pain Descriptors / Indicators: Discomfort Pain Intervention(s): Monitored during session    Home Living                      Prior Function            PT Goals (current goals can now be found in the care plan section) Acute Rehab PT Goals Patient Stated Goal: to be independent PT Goal Formulation: With patient Time For Goal Achievement: 08/22/17 Potential to Achieve Goals: Good Progress towards PT goals: Progressing toward goals    Frequency    Min 5X/week      PT Plan Current plan remains appropriate    Co-evaluation  AM-PAC PT "6 Clicks" Daily Activity  Outcome Measure  Difficulty turning over in bed (including adjusting bedclothes, sheets and blankets)?: A Little Difficulty moving from lying on back to sitting on the side of the bed? : Unable Difficulty sitting down on and standing up from a chair with arms (e.g., wheelchair, bedside commode, etc,.)?: Unable Help needed moving to and from a bed to chair (including a wheelchair)?: A Little Help needed walking in hospital room?: A Little Help needed climbing 3-5 steps with a railing? : A Lot 6 Click Score: 13    End of Session Equipment Utilized During Treatment: Back brace Activity Tolerance: Patient limited by pain;Patient  tolerated treatment well Patient left: in chair;with call bell/phone within reach Nurse Communication: Mobility status PT Visit Diagnosis: Other abnormalities of gait and mobility (R26.89);Other symptoms and signs involving the nervous system (R29.898);Pain Pain - Right/Left: Left Pain - part of body: Hip;Leg     Time: 6237-6283 PT Time Calculation (min) (ACUTE ONLY): 21 min  Charges:  $Gait Training: 8-22 mins                    G Codes:       29-Aug-2017  Irwin [name], PT [phone]  (pager)   [name] Kinsly Hild 2017/08/29, 1:52 PM

## 2017-08-11 NOTE — Progress Notes (Signed)
Pt. States she can place cpap on when she is ready.

## 2017-08-11 NOTE — Discharge Summary (Signed)
Physician Discharge Summary  Patient ID: Tracey Spencer MRN: [id number] DOB/AGE: 66-May-1952 66 y.o.  Admit date: 08/07/2017 Discharge date: 08/11/2017  Admission Diagnoses:  SPONDYLOLISTHESIS, LUMBAR REGION, lumbar synovial cyst, lumbar stenosis, herniated lumbar disc, lumbago, radiculopathy L 45 and L 5 S 1 levels  Discharge Diagnoses: SPONDYLOLISTHESIS, LUMBAR REGION, lumbar synovial cyst, lumbar stenosis, herniated lumbar disc, lumbago, radiculopathy L 45 and L 5 S 1 levels  Active Problems:   Spondylolisthesis of lumbar region   Discharged Condition: good  Hospital Course: Patient was admitted by Dr. Maeola Harman who performed an L4-5 and L5-S1 lumbar decompression and arthrodesis.  Postoperatively the patient was begun on PT and OT and has been able to progress her transfers and mobility.  She is using a rolling walker.  Arrangements have been made for transfer to a skilled nursing facility [facility] in [place]) for continued rehabilitation following surgery.  She has been using hydrocodone/APAP 7.5/325, 2 tablets every 4 hours as needed pain.  She should return for follow-up with Dr. Venetia Maxon [phone]) in his office in a couple of weeks.  Consults: Physical therapy, occupational therapy, social work  Discharge Exam: Blood pressure (!) 161/99, pulse 76, temperature 98 F (36.7 C), temperature source Oral, resp. rate 20, height 5\' 2"  (1.575 m), weight 87.1 kg (192 lb 1.6 oz), SpO2 98 %. Dressing was removed on the day of discharge, the incision is healing nicely.  There is no erythema or drainage.  We have recommended leaving it open to air.  Disposition: Discharge disposition: 03-Skilled Nursing Facility       Discharge Instructions    Discharge wound care:   Complete by:  As directed    Leave the wound open to air. Shower at least every other day with the wound uncovered. Water and soapy water should run over the incision area. Do not wash directly on the incision  for 2 weeks.   Other Restrictions   Complete by:  As directed    Limit bending, lifting, and bending.     Allergies as of 08/11/2017      Reactions   Oxycodone Rash      Medication List    TAKE these medications   aspirin EC 81 MG tablet Take 81 mg by mouth daily.   atorvastatin 40 MG tablet Commonly known as:  LIPITOR Take 40 mg by mouth daily at 6 PM.   carvedilol 6.25 MG tablet Commonly known as:  COREG Take 6.25 mg by mouth 2 (two) times daily with a meal.   HYDROcodone-acetaminophen 7.5-325 MG tablet Commonly known as:  NORCO Take 1-2 tablets by mouth every 4 (four) hours as needed (pain).   lisinopril 20 MG tablet Commonly known as:  PRINIVIL,ZESTRIL Take 20 mg by mouth daily.   Vitamin D-3 5000 units Tabs Take 5,000 Units by mouth daily.            Discharge Care Instructions  (From admission, onward)        Start     Ordered   08/11/17 0000  Discharge wound care:    Comments:  Leave the wound open to air. Shower at least every other day with the wound uncovered. Water and soapy water should run over the incision area. Do not wash directly on the incision for 2 weeks.   08/11/17 1016     Contact information for after-discharge care    Destination    [facility] .   Service:  Skilled Nursing Contact information: [address]  [address] [place] [address] [phone]              Signed: Hewitt Shorts 08/11/2017, 10:16 AM

## 2017-08-11 NOTE — Clinical Social Work Placement (Signed)
   CLINICAL SOCIAL WORK PLACEMENT  NOTE  Date:  08/11/2017  Patient Details  Name: Tracey Spencer MRN: [id number] Date of Birth: 10/10/1950  Clinical Social Work is seeking post-discharge placement for this patient at the Skilled  Nursing Facility level of care (*CSW will initial, date and re-position this form in  chart as items are completed):  Yes   Patient/family provided with [facility] Clinical Social Work Department's list of facilities offering this level of care within the geographic area requested by the patient (or if unable, by the patient's family).  Yes   Patient/family informed of their freedom to choose among providers that offer the needed level of care, that participate in Medicare, Medicaid or managed care program needed by the patient, have an available bed and are willing to accept the patient.  Yes   Patient/family informed of [facility]'s ownership interest in [facility] and [facility], as well as of the fact that they are under no obligation to receive care at these facilities.  PASRR submitted to EDS on ([place] doesn't require pasrr)     PASRR number received on       Existing PASRR number confirmed on       FL2 transmitted to all facilities in geographic area requested by pt/family on 08/08/17     FL2 transmitted to all facilities within larger geographic area on       Patient informed that his/her managed care company has contracts with or will negotiate with certain facilities, including the following:        Yes   Patient/family informed of bed offers received.  Patient chooses bed at [facility]     Physician recommends and patient chooses bed at      Patient to be transferred to [facility] on 08/11/17.  Patient to be transferred to facility by PTAR     Patient family notified on 08/11/17 of transfer.  Name of family member notified:  Callie, spouse     PHYSICIAN Please prepare priority  discharge summary, including medications     Additional Comment:    _______________________________________________ Robb Matar, LCSWA 08/11/2017, 10:49 AM

## 2017-08-11 NOTE — Progress Notes (Addendum)
10:46am- CSW received call back from [facility] and was informed that they can transport to the facility. CSW has faxed over all needed information as well as placed all needed documents on chart. There are no further CSW needs. CSW will sign off.   10:25am- CSW has scheduled pick up for 12pm.CSW to get call back today to confirm that they can take pt to the facility.   CSW spoke with Zella Ball from [place] in [place] and was informed that they are able to take pt today. CSW has spoke with MD Newell Coral and was informed that he is planning to discharge pt today. CSW has spoken with pt and confirmed that pt is needing to be transported by PTAR to facility. CSW will fax over discharge summary and any hard scripts to facility by 12pm as they requested information by that time. Pt must be at the facility no later than 2pm. CSW to arrange for transport once RN has gotten needed equipment from PT. RN to call report to [phone] [employer].    Claude Manges Viet Kemmerer, MSW, LCSW-A Emergency Department Clinical Social Worker [phone]

## 2017-08-11 NOTE — Progress Notes (Signed)
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 S/p elected TLIF at L 45 and L 5 S 1 levels.   OT comments  Continued educaiton on use of AE for LB ADL and hygiene after toileting while adhering to back precautions. Pt appropriate to continue OT at SNF.   Follow Up Recommendations  SNF    Equipment Recommendations  Other (comment)(TBA)    Recommendations for Other Services      Precautions / Restrictions Precautions Precautions: Back;Fall Precaution Comments: Reviewed precautions verbally and pt was able to recall 3/3  Required Braces or Orthoses: Spinal Brace Spinal Brace: Lumbar corset;Applied in sitting position Other Brace/Splint: applied in sitting       Mobility Bed Mobility               General bed mobility comments: OOB in chair  Transfers        Min guard              Balance                                           ADL either performed or assessed with clinical judgement   ADL                                         General ADL Comments: Pt with questions regarding use of AE. Reviewed recommendation for using tongs adn wipes for pericare. Recommend pt have her son purchase tongs for use in SNF. REviewed need to use AE for LB ADL. Pt very appreciative.      Vision       Perception     Praxis      Cognition Arousal/Alertness: Awake/alert Behavior During Therapy: WFL for tasks assessed/performed Overall Cognitive Status: Within Functional Limits for tasks assessed                                          Exercises     Shoulder Instructions       General Comments      Pertinent Vitals/ Pain       Pain Assessment: Faces Faces Pain Scale: Hurts a  little bit Pain Location: back, left hip and leg Pain Descriptors / Indicators: Discomfort Pain Intervention(s): Limited activity within patient's tolerance  Home Living                                          Prior Functioning/Environment              Frequency           Progress Toward Goals  OT Goals(current goals can now be found in the care plan section)  Progress towards OT goals: Progressing toward goals  Acute Rehab OT Goals Patient Stated Goal: to be independent OT Goal Formulation: With patient Time For Goal Achievement: 08/22/17 Potential to Achieve Goals: Good ADL Goals  Pt Will Perform Upper Body Dressing: sitting;with modified independence Pt Will Perform Lower Body Dressing: with modified independence;with adaptive equipment;sitting/lateral leans Pt Will Transfer to Toilet: with modified independence;ambulating Pt Will Perform Toileting - Clothing Manipulation and hygiene: with modified independence;sit to/from stand  Plan Discharge plan remains appropriate    Co-evaluation                 AM-PAC PT "6 Clicks" Daily Activity     Outcome Measure   Help from another person eating meals?: None Help from another person taking care of personal grooming?: A Little Help from another person toileting, which includes using toliet, bedpan, or urinal?: A Little Help from another person bathing (including washing, rinsing, drying)?: A Little Help from another person to put on and taking off regular upper body clothing?: A Little Help from another person to put on and taking off regular lower body clothing?: A Little 6 Click Score: 19    End of Session Equipment Utilized During Treatment: Back brace  OT Visit Diagnosis: Unsteadiness on feet (R26.81);Muscle weakness (generalized) (M62.81)   Activity Tolerance Patient tolerated treatment well   Patient Left in chair;with call bell/phone within reach   Nurse Communication Other  (comment)(appropriate for DC)        Time: 1610-9604 OT Time Calculation (min): 14 min  Charges: OT General Charges $OT Visit: 1 Visit OT Treatments $Self Care/Home Management : 8-22 mins  Luisa Dago, OT/L  OT Clinical Specialist [phone]    [facility] 08/11/2017, 1:03 PM

## 2017-08-11 NOTE — Progress Notes (Addendum)
11:45 Patient verbalized understanding of all discharge instruction, new medications and importance of follow up visits. Patient left with all belongings in stable condition. PTAR was given paperwork, prescriptions and transported the patient to [facility] in [place].

## 2018-08-22 IMAGING — RF DG LUMBAR SPINE COMPLETE 4+V
1 series · 4 of 4 positions shown · non-contrast
Comparison: None.

CLINICAL DATA: Lumbar fusion

EXAM:
DG C-ARM 61-120 MIN; LUMBAR SPINE - COMPLETE 4+ VIEW

[Series 1: run · 4 of 4 slices shown]
[im 1/4]
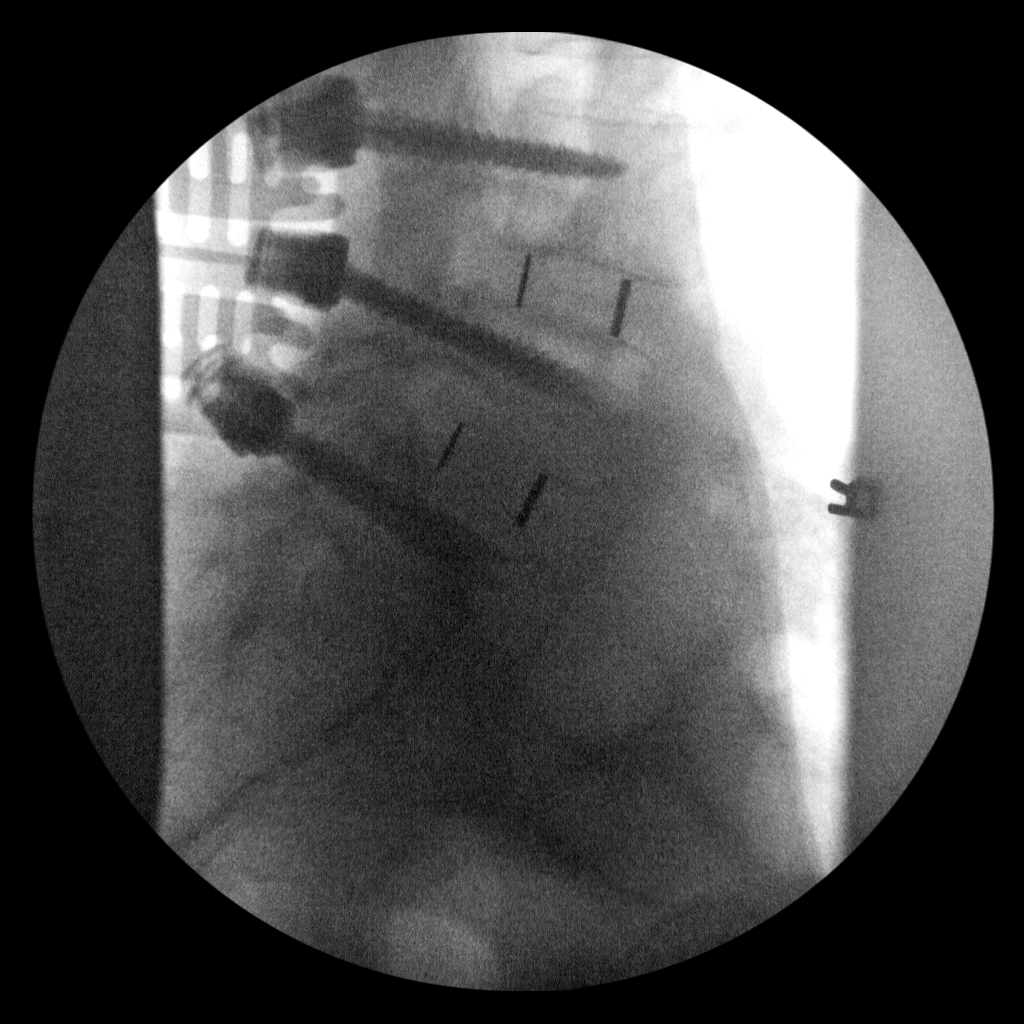
[im 2/4]
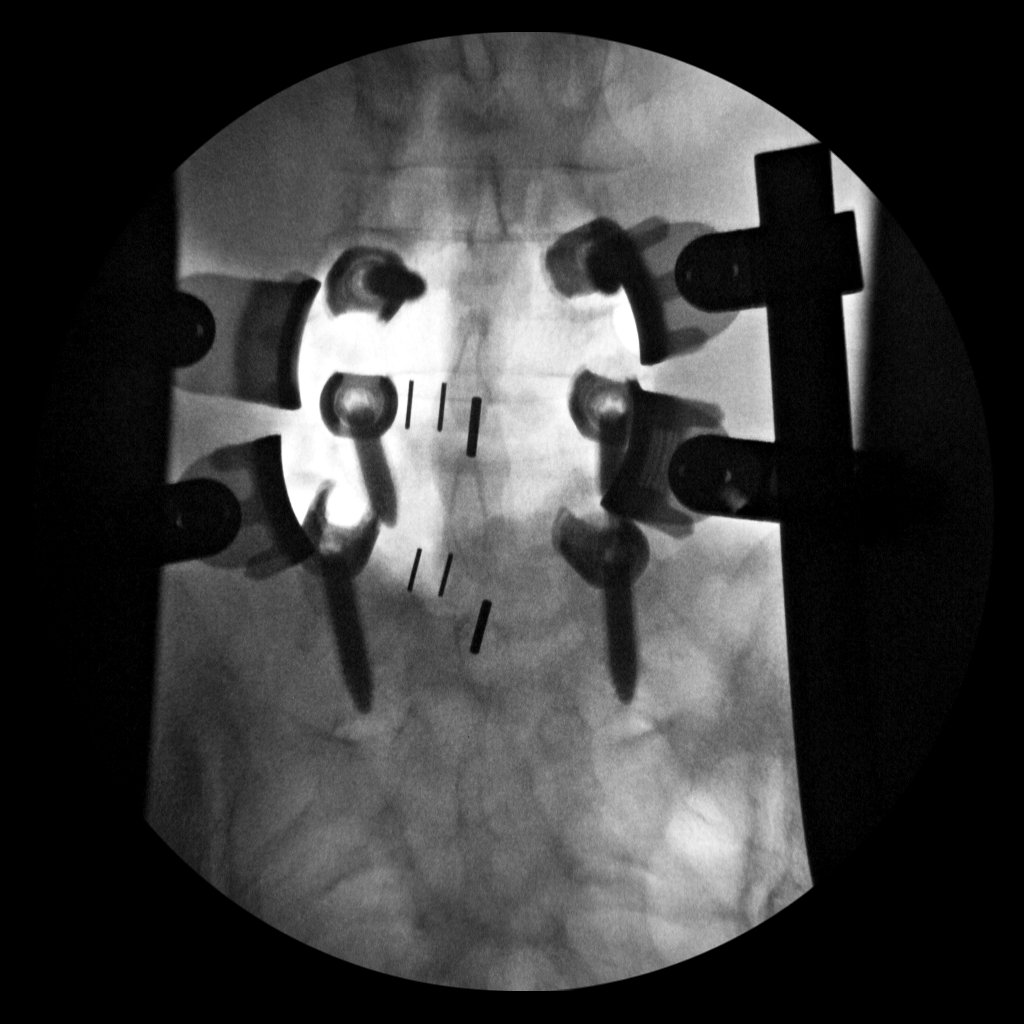
[im 3/4]
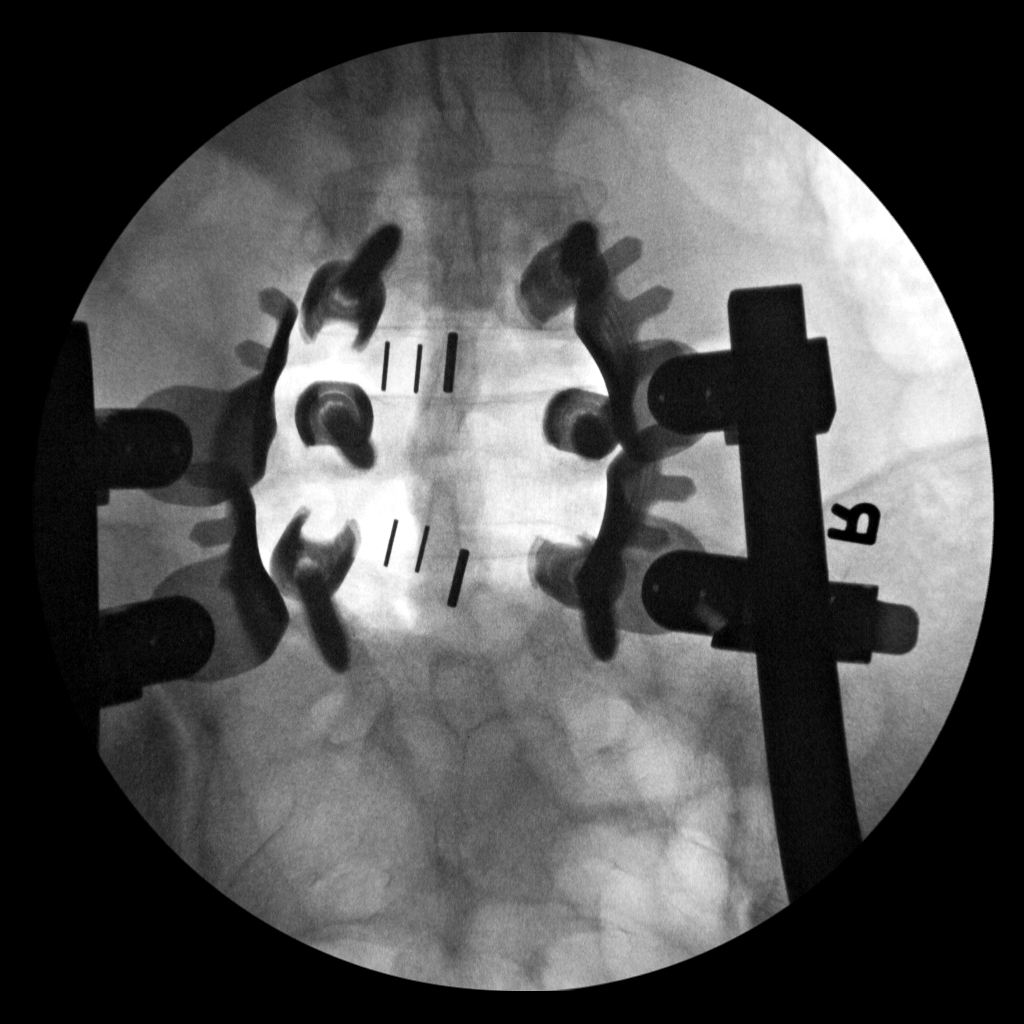
[im 4/4]
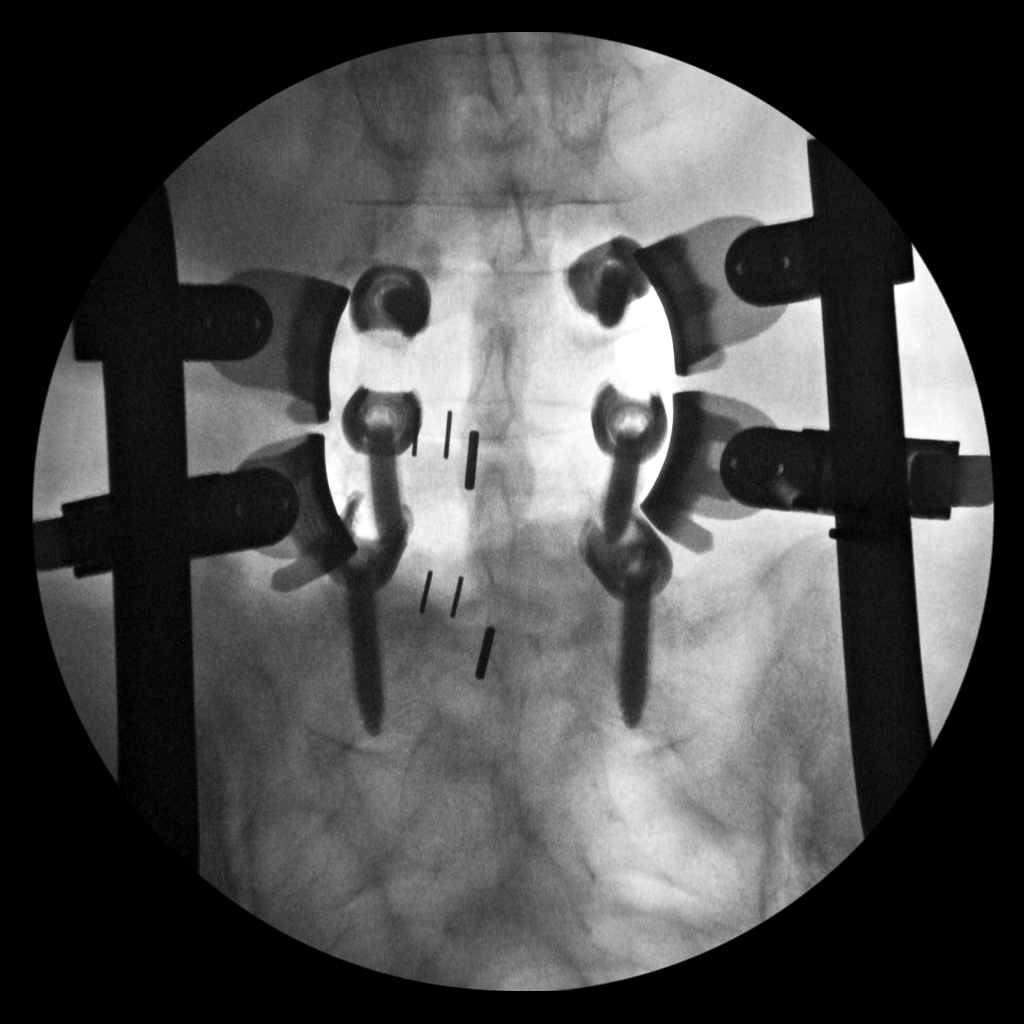

[4 of 4 positions shown; findings below may reference images not displayed]

FLUOROSCOPY TIME:  Fluoroscopy Time:  49 seconds

Radiation Exposure Index (if provided by the fluoroscopic device):
Not available

Number of Acquired Spot Images: 4
FINDINGS: Interbody fusion at L4-5 and L5-S1 is noted with pedicle screws in
place. The posterior fixation elements have not been placed.
IMPRESSION: Intraoperative lumbar fusion
